# Patient Record
Sex: Male | Born: 1940 | Race: White | Hispanic: No | Marital: Married | Smoking: Never smoker
Health system: Southern US, Community
[De-identification: ages and names within clinical notes are randomized; demographics above are authoritative.]

## PROBLEM LIST (undated history)

## (undated) ENCOUNTER — Emergency Department
Admission: EM | Discharge status: Against Medical Advice | Payer: BLUE CROSS/BLUE SHIELD | Attending: Emergency Medicine | Admitting: Emergency Medicine

## (undated) DIAGNOSIS — Z860100 Personal history of colon polyps, unspecified: Secondary | ICD-10-CM

## (undated) DIAGNOSIS — C61 Malignant neoplasm of prostate: Secondary | ICD-10-CM

## (undated) DIAGNOSIS — I10 Essential (primary) hypertension: Secondary | ICD-10-CM

## (undated) DIAGNOSIS — Z973 Presence of spectacles and contact lenses: Secondary | ICD-10-CM

## (undated) DIAGNOSIS — R011 Cardiac murmur, unspecified: Secondary | ICD-10-CM

## (undated) DIAGNOSIS — F0280 Dementia in other diseases classified elsewhere without behavioral disturbance: Secondary | ICD-10-CM

## (undated) DIAGNOSIS — F0390 Unspecified dementia without behavioral disturbance: Secondary | ICD-10-CM

## (undated) DIAGNOSIS — G309 Alzheimer's disease, unspecified: Secondary | ICD-10-CM

## (undated) HISTORY — DX: Alzheimer's disease, unspecified: G30.9

## (undated) HISTORY — PX: PROSTATE SURGERY: SHX751

## (undated) HISTORY — DX: Cardiac murmur, unspecified: R01.1

## (undated) HISTORY — DX: Essential (primary) hypertension: I10

## (undated) HISTORY — DX: Malignant neoplasm of prostate: C61

## (undated) HISTORY — DX: Personal history of colonic polyps: Z86.010

## (undated) HISTORY — PX: PROSTATE BIOPSY: SHX241

## (undated) HISTORY — PX: TONSILLECTOMY: SUR1361

## (undated) HISTORY — DX: Personal history of colon polyps, unspecified: Z86.0100

---

## 1999-08-05 ENCOUNTER — Encounter: Admission: RE | Admit: 1999-08-05 | Discharge: 1999-08-05 | Payer: Self-pay | Admitting: *Deleted

## 1999-08-05 ENCOUNTER — Encounter: Payer: Self-pay | Admitting: *Deleted

## 2004-04-27 ENCOUNTER — Encounter: Payer: Self-pay | Admitting: Internal Medicine

## 2004-04-27 HISTORY — PX: COLONOSCOPY W/ POLYPECTOMY: SHX1380

## 2004-04-27 LAB — HM COLONOSCOPY

## 2005-03-20 ENCOUNTER — Ambulatory Visit: Payer: Self-pay | Admitting: Internal Medicine

## 2005-03-28 ENCOUNTER — Ambulatory Visit: Payer: Self-pay | Admitting: Internal Medicine

## 2005-08-24 ENCOUNTER — Ambulatory Visit: Payer: Self-pay | Admitting: Internal Medicine

## 2005-09-05 ENCOUNTER — Ambulatory Visit: Payer: Self-pay | Admitting: Internal Medicine

## 2005-10-26 ENCOUNTER — Ambulatory Visit: Payer: Self-pay | Admitting: Internal Medicine

## 2006-02-23 ENCOUNTER — Ambulatory Visit: Payer: Self-pay | Admitting: Internal Medicine

## 2006-08-22 ENCOUNTER — Ambulatory Visit: Payer: Self-pay | Admitting: Internal Medicine

## 2006-08-22 LAB — CONVERTED CEMR LAB
ALT: 35 units/L (ref 0–40)
AST: 31 units/L (ref 0–37)
Albumin: 3.8 g/dL (ref 3.5–5.2)
Alkaline Phosphatase: 98 units/L (ref 39–117)
BUN: 13 mg/dL (ref 6–23)
Basophils Absolute: 0 10*3/uL (ref 0.0–0.1)
Basophils Relative: 0.5 % (ref 0.0–1.0)
Bilirubin, Direct: 0.1 mg/dL (ref 0.0–0.3)
CO2: 30 meq/L (ref 19–32)
Calcium: 9.7 mg/dL (ref 8.4–10.5)
Chloride: 104 meq/L (ref 96–112)
Cholesterol: 232 mg/dL (ref 0–200)
Creatinine, Ser: 1 mg/dL (ref 0.4–1.5)
Direct LDL: 155.6 mg/dL
Eosinophils Absolute: 0.1 10*3/uL (ref 0.0–0.6)
Eosinophils Relative: 1.3 % (ref 0.0–5.0)
GFR calc Af Amer: 96 mL/min
GFR calc non Af Amer: 79 mL/min
Glucose, Bld: 103 mg/dL — ABNORMAL HIGH (ref 70–99)
HCT: 44.2 % (ref 39.0–52.0)
HDL: 56.9 mg/dL (ref >39.0)
Hemoglobin: 15.4 g/dL (ref 13.0–17.0)
Lymphocytes Relative: 31 % (ref 12.0–46.0)
MCHC: 34.8 g/dL (ref 30.0–36.0)
MCV: 94.3 fL (ref 78.0–100.0)
Monocytes Absolute: 0.6 10*3/uL (ref 0.2–0.7)
Monocytes Relative: 10.6 % (ref 3.0–11.0)
Neutro Abs: 3 10*3/uL (ref 1.4–7.7)
Neutrophils Relative %: 56.6 % (ref 43.0–77.0)
PSA: 2.26 ng/mL (ref 0.10–4.00)
Platelets: 319 10*3/uL (ref 150–400)
Potassium: 4 meq/L (ref 3.5–5.1)
RBC: 4.69 M/uL (ref 4.22–5.81)
RDW: 12.4 % (ref 11.5–14.6)
Sodium: 143 meq/L (ref 135–145)
TSH: 1.32 microintl units/mL (ref 0.35–5.50)
Total Bilirubin: 1.2 mg/dL (ref 0.3–1.2)
Total CHOL/HDL Ratio: 4.1
Total Protein: 7.1 g/dL (ref 6.0–8.3)
Triglycerides: 131 mg/dL (ref 0–149)
VLDL: 26 mg/dL (ref 0–40)
WBC: 5.4 10*3/uL (ref 4.5–10.5)

## 2006-08-29 ENCOUNTER — Ambulatory Visit: Payer: Self-pay | Admitting: Internal Medicine

## 2007-01-18 ENCOUNTER — Ambulatory Visit: Payer: Self-pay | Admitting: Internal Medicine

## 2007-02-05 DIAGNOSIS — I10 Essential (primary) hypertension: Secondary | ICD-10-CM

## 2007-02-05 DIAGNOSIS — Z860100 Personal history of colon polyps, unspecified: Secondary | ICD-10-CM | POA: Insufficient documentation

## 2007-02-05 DIAGNOSIS — I4510 Unspecified right bundle-branch block: Secondary | ICD-10-CM

## 2007-02-05 DIAGNOSIS — E785 Hyperlipidemia, unspecified: Secondary | ICD-10-CM

## 2007-03-06 ENCOUNTER — Ambulatory Visit: Payer: Self-pay | Admitting: Internal Medicine

## 2007-04-24 ENCOUNTER — Ambulatory Visit: Payer: Self-pay | Admitting: Internal Medicine

## 2007-07-25 ENCOUNTER — Encounter: Payer: Self-pay | Admitting: Internal Medicine

## 2007-09-03 ENCOUNTER — Ambulatory Visit: Payer: Self-pay | Admitting: Internal Medicine

## 2007-09-03 LAB — CONVERTED CEMR LAB
ALT: 25 units/L (ref 0–53)
AST: 29 units/L (ref 0–37)
Albumin: 3.7 g/dL (ref 3.5–5.2)
Alkaline Phosphatase: 94 units/L (ref 39–117)
BUN: 11 mg/dL (ref 6–23)
Basophils Absolute: 0 10*3/uL (ref 0.0–0.1)
Basophils Relative: 0.7 % (ref 0.0–1.0)
Bilirubin Urine: NEGATIVE
Bilirubin, Direct: 0.2 mg/dL (ref 0.0–0.3)
Blood in Urine, dipstick: NEGATIVE
CO2: 29 meq/L (ref 19–32)
Calcium: 9.8 mg/dL (ref 8.4–10.5)
Chloride: 105 meq/L (ref 96–112)
Cholesterol: 200 mg/dL (ref 0–200)
Creatinine, Ser: 1.2 mg/dL (ref 0.4–1.5)
Eosinophils Absolute: 0.1 10*3/uL (ref 0.0–0.6)
Eosinophils Relative: 2.5 % (ref 0.0–5.0)
GFR calc Af Amer: 78 mL/min
GFR calc non Af Amer: 64 mL/min
Glucose, Bld: 97 mg/dL (ref 70–99)
Glucose, Urine, Semiquant: NEGATIVE
HCT: 43.7 % (ref 39.0–52.0)
HDL: 53.8 mg/dL (ref >39.0)
Hemoglobin: 14.6 g/dL (ref 13.0–17.0)
Ketones, urine, test strip: NEGATIVE
LDL Cholesterol: 130 mg/dL — ABNORMAL HIGH (ref 0–99)
Lymphocytes Relative: 45.2 % (ref 12.0–46.0)
MCHC: 33.5 g/dL (ref 30.0–36.0)
MCV: 95.2 fL (ref 78.0–100.0)
Monocytes Absolute: 0.4 10*3/uL (ref 0.2–0.7)
Monocytes Relative: 11.3 % — ABNORMAL HIGH (ref 3.0–11.0)
Neutro Abs: 1.6 10*3/uL (ref 1.4–7.7)
Neutrophils Relative %: 40.3 % — ABNORMAL LOW (ref 43.0–77.0)
Nitrite: NEGATIVE
PSA: 2.13 ng/mL (ref 0.10–4.00)
Platelets: 298 10*3/uL (ref 150–400)
Potassium: 5.7 meq/L — ABNORMAL HIGH (ref 3.5–5.1)
Protein, U semiquant: NEGATIVE
RBC: 4.59 M/uL (ref 4.22–5.81)
RDW: 12.1 % (ref 11.5–14.6)
Sodium: 141 meq/L (ref 135–145)
Specific Gravity, Urine: 1.02
TSH: 1.09 microintl units/mL (ref 0.35–5.50)
Total Bilirubin: 1 mg/dL (ref 0.3–1.2)
Total CHOL/HDL Ratio: 3.7
Total Protein: 6.5 g/dL (ref 6.0–8.3)
Triglycerides: 79 mg/dL (ref 0–149)
Urobilinogen, UA: 0.2
VLDL: 16 mg/dL (ref 0–40)
WBC Urine, dipstick: NEGATIVE
WBC: 3.9 10*3/uL — ABNORMAL LOW (ref 4.5–10.5)
pH: 5.5

## 2007-09-17 ENCOUNTER — Ambulatory Visit: Payer: Self-pay | Admitting: Internal Medicine

## 2007-09-17 LAB — HM COLONOSCOPY

## 2008-03-17 ENCOUNTER — Ambulatory Visit: Payer: Self-pay | Admitting: Internal Medicine

## 2008-09-15 ENCOUNTER — Ambulatory Visit: Payer: Self-pay | Admitting: Internal Medicine

## 2008-09-16 LAB — CONVERTED CEMR LAB
BUN: 11 mg/dL (ref 6–23)
CO2: 28 meq/L (ref 19–32)
Calcium: 9.3 mg/dL (ref 8.4–10.5)
Chloride: 104 meq/L (ref 96–112)
Cholesterol: 224 mg/dL — ABNORMAL HIGH (ref 0–200)
Creatinine, Ser: 1 mg/dL (ref 0.4–1.5)
Direct LDL: 137.1 mg/dL
GFR calc non Af Amer: 78.93 mL/min (ref >60)
Glucose, Bld: 99 mg/dL (ref 70–99)
HDL: 62.8 mg/dL (ref >39.00)
Potassium: 4.8 meq/L (ref 3.5–5.1)
Sodium: 140 meq/L (ref 135–145)
Total CHOL/HDL Ratio: 4
Triglycerides: 115 mg/dL (ref 0.0–149.0)
VLDL: 23 mg/dL (ref 0.0–40.0)

## 2009-03-24 ENCOUNTER — Ambulatory Visit: Payer: Self-pay | Admitting: Internal Medicine

## 2010-05-02 ENCOUNTER — Ambulatory Visit: Payer: Self-pay | Admitting: Internal Medicine

## 2010-05-03 LAB — CONVERTED CEMR LAB
BUN: 14 mg/dL (ref 6–23)
CO2: 25 meq/L (ref 19–32)
Calcium: 9.4 mg/dL (ref 8.4–10.5)
Chloride: 98 meq/L (ref 96–112)
Cholesterol: 194 mg/dL (ref 0–200)
Creatinine, Ser: 1 mg/dL (ref 0.4–1.5)
GFR calc non Af Amer: 78.55 mL/min (ref >60)
Glucose, Bld: 110 mg/dL — ABNORMAL HIGH (ref 70–99)
HDL: 67.3 mg/dL (ref >39.00)
LDL Cholesterol: 109 mg/dL — ABNORMAL HIGH (ref 0–99)
PSA: 3.4 ng/mL (ref 0.10–4.00)
Potassium: 4.1 meq/L (ref 3.5–5.1)
Sodium: 134 meq/L — ABNORMAL LOW (ref 135–145)
TSH: 0.99 microintl units/mL (ref 0.35–5.50)
Total CHOL/HDL Ratio: 3
Triglycerides: 91 mg/dL (ref 0.0–149.0)
VLDL: 18.2 mg/dL (ref 0.0–40.0)

## 2010-07-26 NOTE — Procedures (Signed)
Summary: Colonoscopy, Polypectomy/[facility]  Colonoscopy, Polypectomy/[facility]   Imported By: Maryln Gottron 05/18/2010 15:13:32  _____________________________________________________________________  External Attachment:    Type:   Image     Comment:   External Document

## 2010-07-26 NOTE — Assessment & Plan Note (Signed)
Summary: med check/refill/cjr   Vital Signs:  Patient profile:   70 year old male Weight:      175 pounds BMI:     25.20 Temp:     97.7 degrees F oral Pulse rate:   76 / minute BP sitting:   152 / 76  (left arm) Cuff size:   large  Vitals Entered By: Alfred Levins, CMA (May 02, 2010 10:46 AM) CC: renew meds   CC:  renew meds.  History of Present Illness:  Follow-Up Visit      This is a 70 year old man who presents for Follow-up visit.  The patient denies chest pain and palpitations.  Since the last visit the patient notes no new problems or concerns.  The patient reports taking meds as prescribed and monitoring BP.  When questioned about possible medication side effects, the patient notes none.   home BPs 110s-140/70s  All other systems reviewed and were negative   Current Medications (verified): 1)  Lisinopril 10 Mg  Tabs (Lisinopril) .... Take 1 Tab By Mouth Daily--Needs Office Visit For Additional Refills 2)  Occuvite .... Once Daily  Allergies (verified): No Known Drug Allergies  Family History: father --deceased 7 (vascular in nature) mother htn alive at 88---doing well except some MSK complaints  Physical Exam  General:  alert and well-developed.   Head:  normocephalic and atraumatic.   Eyes:  pupils equal and pupils round.   Neck:  No deformities, masses, or tenderness noted. Chest Wall:  No deformities, masses, tenderness or gynecomastia noted. Lungs:  Normal respiratory effort, chest expands symmetrically. Lungs are clear to auscultation, no crackles or wheezes. Heart:  normal rate and regular rhythm.   Abdomen:  Bowel sounds positive,abdomen soft and non-tender without masses, organomegaly or hernias noted.   Impression & Recommendations:  Problem # 1:  HYPERTENSION (ICD-401.9)  sounds better controlled at home monitor at home. goal bp < 140/90 His updated medication list for this problem includes:    Lisinopril 20 Mg Tabs (Lisinopril) .Marland Kitchen... 1  tablet by mouth daily or as directed  BP today: 152/76 Prior BP: 138/74 (09/15/2008)  Labs Reviewed: K+: 4.8 (09/15/2008) Creat: : 1.0 (09/15/2008)   Chol: 224 (09/15/2008)   HDL: 62.80 (09/15/2008)   LDL: 130 (09/03/2007)   TG: 115.0 (09/15/2008)  Orders: Venipuncture (10272) TLB-BMP (Basic Metabolic Panel-BMET) (80048-METABOL) TLB-Lipid Panel (80061-LIPID)  Complete Medication List: 1)  Lisinopril 20 Mg Tabs (Lisinopril) .Marland Kitchen.. 1 tablet by mouth daily or as directed 2)  Occuvite  .... Once daily  Other Orders: TLB-TSH (Thyroid Stimulating Hormone) (84443-TSH) TLB-PSA (Prostate Specific Antigen) (84153-PSA)  Prescriptions: LISINOPRIL 20 MG TABS (LISINOPRIL) 1 tablet by mouth daily or as directed  #90 x 3   Entered and Authorized by:   Birdie Sons MD   Signed by:   Birdie Sons MD on 05/02/2010   Method used:   Electronically to        [facility] [phone]* (retail)       [address]       [place], [place]  [address]       Ph: [phone] or [phone]       Fax: [phone]   RxID:   [id number]    Orders Added: 1)  Venipuncture [54270] 2)  TLB-BMP (Basic Metabolic Panel-BMET) [80048-METABOL] 3)  TLB-Lipid Panel [80061-LIPID] 4)  TLB-TSH (Thyroid Stimulating Hormone) [84443-TSH] 5)  TLB-PSA (Prostate Specific Antigen) [62376-EGB] 6)  Est. Patient Level III [15176]  Appended Document: Orders Update  Clinical Lists Changes  Orders: Added new Service order of Specimen Handling (99371) - Signed      Appended Document: med check/refill/cjr    Nurse Visit   Allergies: No Known Drug Allergies  Orders Added: 1)  Flu Vaccine 2yrs + MEDICARE PATIENTS [Q2039] 2)  Administration Flu vaccine - MCR [G0008] Flu Vaccine Consent Questions     Do you have a history of severe allergic reactions to this vaccine? no    Any prior history of allergic reactions to egg and/or gelatin? no    Do you have a sensitivity to the preservative Thimersol? no    Do you  have a past history of Guillan-Barre Syndrome? no    Do you currently have an acute febrile illness? no    Have you ever had a severe reaction to latex? no    Vaccine information given and explained to patient? yes    Are you currently pregnant? no    Lot Number:[id number]   Exp Date:12/24/2010   Site Given  Left Deltoid IM .[email or web address]

## 2010-10-31 ENCOUNTER — Ambulatory Visit (INDEPENDENT_AMBULATORY_CARE_PROVIDER_SITE_OTHER): Payer: Medicare Other | Admitting: Internal Medicine

## 2010-10-31 DIAGNOSIS — Z23 Encounter for immunization: Secondary | ICD-10-CM

## 2010-10-31 DIAGNOSIS — Z2911 Encounter for prophylactic immunotherapy for respiratory syncytial virus (RSV): Secondary | ICD-10-CM

## 2011-01-06 ENCOUNTER — Ambulatory Visit (INDEPENDENT_AMBULATORY_CARE_PROVIDER_SITE_OTHER): Payer: Medicare Other | Admitting: Internal Medicine

## 2011-01-06 ENCOUNTER — Encounter: Payer: Self-pay | Admitting: Internal Medicine

## 2011-01-06 DIAGNOSIS — J40 Bronchitis, not specified as acute or chronic: Secondary | ICD-10-CM

## 2011-01-06 MED ORDER — CHLORPHENIRAMINE-HYDROCODONE 8-10 MG/5ML PO LQCR: 5.0000 mL | Freq: Two times a day (BID) | ORAL | Status: DC | PRN

## 2011-01-06 MED ORDER — DOXYCYCLINE HYCLATE 100 MG PO TABS: 100.0000 mg | ORAL_TABLET | Freq: Two times a day (BID) | ORAL | Status: AC

## 2011-01-06 NOTE — Assessment & Plan Note (Signed)
Begin ten-day course of doxycycline. Attempt Tussionex when necessary cough. Cautioned regarding possible sedating effect. Followup if no improvement or worsening 

## 2011-01-06 NOTE — Progress Notes (Signed)
  Subjective:    Patient ID: Jerry Carroll, male    DOB: September 28, 1940, 70 y.o.   MRN: [id number]  HPI Pt presents to clinic for evaluation of cough. Notes two week history of cough intermittently productive for green sputum without hemoptysis. Cough is worse at night and is interfering with sleep. Has nasal congestion and runny nose as well as associated sore throat. No known sick exposure. Not improving with over-the-counter medication. No other exacerbating or alleviating factors. No other complaints.  Reviewed past medical history, medications and allergies.  Review of Systems see history of present illness      Objective:   Physical Exam    Physical Exam  [nursing notereviewed. Constitutional:  appears well-developed and well-nourished. No distress.  HENT:  Head: Normocephalic and atraumatic.  Right Ear: Tympanic membrane, external ear and ear canal normal.  Left Ear: Tympanic membrane, external ear and ear canal normal.  Nose: Nose normal.  Mouth/Throat: Oropharynx is clear and moist. No oropharyngeal exudate.  Eyes: Conjunctivae are normal. No scleral icterus.  Neck: Neck supple.  Cardiovascular: Normal rate, regular rhythm and normal heart sounds.  Exam reveals no gallop and no friction rub.   No murmur heard. Pulmonary/Chest: Effort normal and breath sounds normal. No respiratory distress.  no wheezes.  no rales.  Lymphadenopathy:     no cervical adenopathy.  Neurological:  alert.  Skin: Skin is warm and dry.  not diaphoretic.      Assessment & Plan:

## 2011-04-18 ENCOUNTER — Ambulatory Visit (INDEPENDENT_AMBULATORY_CARE_PROVIDER_SITE_OTHER): Payer: Medicare Other

## 2011-04-18 DIAGNOSIS — Z23 Encounter for immunization: Secondary | ICD-10-CM

## 2011-05-04 ENCOUNTER — Encounter: Payer: Federal, State, Local not specified - PPO | Admitting: Internal Medicine

## 2011-05-26 ENCOUNTER — Encounter: Payer: Self-pay | Admitting: Internal Medicine

## 2011-05-26 ENCOUNTER — Ambulatory Visit (INDEPENDENT_AMBULATORY_CARE_PROVIDER_SITE_OTHER): Payer: Medicare Other | Admitting: Internal Medicine

## 2011-05-26 VITALS — BP 142/76 | HR 60 | Temp 97.9°F | Ht 70.0 in | Wt 169.0 lb

## 2011-05-26 DIAGNOSIS — Z0000 Encounter for general adult medical examination without abnormal findings: Secondary | ICD-10-CM

## 2011-05-26 DIAGNOSIS — Z23 Encounter for immunization: Secondary | ICD-10-CM

## 2011-05-26 DIAGNOSIS — N400 Enlarged prostate without lower urinary tract symptoms: Secondary | ICD-10-CM

## 2011-05-26 DIAGNOSIS — Z86010 Personal history of colonic polyps: Secondary | ICD-10-CM

## 2011-05-26 DIAGNOSIS — I10 Essential (primary) hypertension: Secondary | ICD-10-CM

## 2011-05-26 DIAGNOSIS — E785 Hyperlipidemia, unspecified: Secondary | ICD-10-CM

## 2011-05-26 LAB — BASIC METABOLIC PANEL
Chloride: 101 mEq/L (ref 96–112)
Creatinine, Ser: 1.1 mg/dL (ref 0.4–1.5)
Potassium: 5 mEq/L (ref 3.5–5.1)

## 2011-05-26 LAB — HEPATIC FUNCTION PANEL
AST: 41 U/L — ABNORMAL HIGH (ref 0–37)
Albumin: 4.1 g/dL (ref 3.5–5.2)
Total Protein: 6.7 g/dL (ref 6.0–8.3)

## 2011-05-26 LAB — POCT URINALYSIS DIPSTICK
Bilirubin, UA: NEGATIVE
Blood, UA: NEGATIVE
Glucose, UA: NEGATIVE
Spec Grav, UA: 1.015

## 2011-05-26 LAB — LIPID PANEL
Cholesterol: 230 mg/dL — ABNORMAL HIGH (ref 0–200)
Total CHOL/HDL Ratio: 3
Triglycerides: 55 mg/dL (ref 0.0–149.0)

## 2011-05-26 LAB — CBC WITH DIFFERENTIAL/PLATELET
Basophils Absolute: 0 10*3/uL (ref 0.0–0.1)
Lymphocytes Relative: 34.6 % (ref 12.0–46.0)
Lymphs Abs: 1.3 10*3/uL (ref 0.7–4.0)
Monocytes Relative: 11.4 % (ref 3.0–12.0)
Platelets: 287 10*3/uL (ref 150.0–400.0)
RDW: 13.7 % (ref 11.5–14.6)

## 2011-05-26 LAB — LDL CHOLESTEROL, DIRECT: Direct LDL: 133.8 mg/dL

## 2011-05-26 LAB — TSH: TSH: 0.91 u[IU]/mL (ref 0.35–5.50)

## 2011-05-28 NOTE — Progress Notes (Signed)
Subjective:    Jerry Carroll is a 70 y.o. male who presents for Medicare Annual/Subsequent preventive examination.   Preventive Screening-Counseling & Management  Tobacco History  Smoking status  . Never Smoker   Smokeless tobacco  . Never Used    Problems Prior to Visit 1.   Current Problems (verified) Patient Active Problem List  Diagnoses  . HYPERLIPIDEMIA  . HYPERTENSION  . BUNDLE BRANCH BLOCK, RIGHT  . COLONIC POLYPS, HX OF  . Bronchitis    Medications Prior to Visit Current Outpatient Prescriptions on File Prior to Visit  Medication Sig Dispense Refill  . lisinopril (PRINIVIL,ZESTRIL) 20 MG tablet Take 10 mg by mouth daily.         Current Medications (verified) Current Outpatient Prescriptions  Medication Sig Dispense Refill  . lisinopril (PRINIVIL,ZESTRIL) 20 MG tablet Take 10 mg by mouth daily.          Allergies (verified) Review of patient's allergies indicates not on file.   PAST HISTORY  Family History No family history on file.  Social History History  Substance Use Topics  . Smoking status: Never Smoker   . Smokeless tobacco: Never Used  . Alcohol Use: Yes    Are there smokers in your home (other than you)?  No  Risk Factors Current exercise habits: Patient exercises regularly. Dietary issues discussed: Follows a low-fat diet.  Cardiac risk factors: advanced age (older than 90 for men, 52 for women).  Depression Screen patient denies depression Activities of Daily Living: Patient is able to do all activities of daily living. No recent falls.    Hearing Difficulties: Patient has no trouble with hearing.  Patient has no memory loss. Cognitive Testing  Patient is alert and oriented without any cognitive abnormalities.   List the Names of Other Physician/Practitioners you currently use: 1.    Indicate any recent Medical Services you may have received from other than [facility] providers in the past year (date may be  approximate).  Immunization History  Administered Date(s) Administered  . Influenza Split 04/18/2011  . Influenza Whole 04/24/2007, 03/17/2008, 03/24/2009, 05/02/2010  . Pneumococcal Polysaccharide 06/26/2005, 05/26/2011  . Tdap 05/26/2011  . Zoster 10/31/2010    Screening Tests Health Maintenance  Topic Date Due  . Influenza Vaccine  03/26/2012  . Colonoscopy  09/16/2017  . Tetanus/tdap  05/25/2021  . Pneumococcal Polysaccharide Vaccine Age 70 And Over  Completed  . Zostavax  Completed    All answers were reviewed with the patient and necessary referrals were made:  Judie Petit, MD   05/28/2011   History reviewed: allergies, current medications, past family history, past medical history, past social history, past surgical history and problem list  Review of Systems     patient denies chest pain, shortness of breath, orthopnea. Denies lower extremity edema, abdominal pain, change in appetite, change in bowel movements. Patient denies rashes, musculoskeletal complaints. No other specific complaints in a complete review of systems.   Objective:      Blood pressure 142/76, pulse 60, temperature 97.9 F (36.6 C), temperature source Oral, height 5\' 10"  (1.778 m), weight 169 lb (76.658 kg). Body mass index is 24.25 kg/(m^2).  well-developed well-nourished male in no acute distress. HEENT exam atraumatic, normocephalic, neck supple without jugular venous distention. Chest clear to auscultation cardiac exam S1-S2 are regular. Abdominal exam overweight with bowel sounds, soft and nontender. Extremities no edema. Neurologic exam is alert with a normal gait.      Assessment:  Well visit   Health  maintenance up-to-date.     Plan:     During the course of the visit the patient was educated and counseled about appropriate screening and preventive services including:    Pneumococcal vaccine   Td vaccine  Prostate cancer screening  Colorectal cancer screening  Diet  review for nutrition referral? Yes ____  Not Indicated _x___   Patient Instructions (the written plan) was given to the patient.  Medicare Attestation I have personally reviewed: The patient's medical and social history Their use of alcohol, tobacco or illicit drugs Their current medications and supplements The patient's functional ability including ADLs,fall risks, home safety risks, cognitive, and hearing and visual impairment Diet and physical activities Evidence for depression or mood disorders  The patient's weight, height, BMI, and visual acuity have been recorded in the chart.  I have made referrals, counseling, and provided education to the patient based on review of the above and I have provided the patient with a written personalized care plan for preventive services.     Judie Petit, MD   05/28/2011

## 2011-09-05 ENCOUNTER — Other Ambulatory Visit: Payer: Self-pay | Admitting: Internal Medicine

## 2012-07-05 ENCOUNTER — Ambulatory Visit: Payer: Medicare Other | Admitting: Internal Medicine

## 2012-07-31 ENCOUNTER — Encounter: Payer: Medicare Other | Admitting: Internal Medicine

## 2012-08-14 ENCOUNTER — Ambulatory Visit (INDEPENDENT_AMBULATORY_CARE_PROVIDER_SITE_OTHER): Payer: Medicare Other | Admitting: Internal Medicine

## 2012-08-14 ENCOUNTER — Encounter: Payer: Self-pay | Admitting: Internal Medicine

## 2012-08-14 VITALS — BP 136/70 | HR 76 | Temp 98.4°F | Ht 70.25 in | Wt 163.0 lb

## 2012-08-14 DIAGNOSIS — R7309 Other abnormal glucose: Secondary | ICD-10-CM

## 2012-08-14 DIAGNOSIS — I10 Essential (primary) hypertension: Secondary | ICD-10-CM

## 2012-08-14 DIAGNOSIS — R972 Elevated prostate specific antigen [PSA]: Secondary | ICD-10-CM

## 2012-08-14 DIAGNOSIS — Z0000 Encounter for general adult medical examination without abnormal findings: Secondary | ICD-10-CM

## 2012-08-14 DIAGNOSIS — D72819 Decreased white blood cell count, unspecified: Secondary | ICD-10-CM

## 2012-08-14 DIAGNOSIS — E785 Hyperlipidemia, unspecified: Secondary | ICD-10-CM

## 2012-08-14 LAB — TSH: TSH: 0.74 u[IU]/mL (ref 0.35–5.50)

## 2012-08-14 LAB — BASIC METABOLIC PANEL
CO2: 25 mEq/L (ref 19–32)
Calcium: 9.8 mg/dL (ref 8.4–10.5)
Chloride: 101 mEq/L (ref 96–112)
Glucose, Bld: 119 mg/dL — ABNORMAL HIGH (ref 70–99)
Potassium: 4.7 mEq/L (ref 3.5–5.1)
Sodium: 136 mEq/L (ref 135–145)

## 2012-08-14 LAB — HEPATIC FUNCTION PANEL
ALT: 20 U/L (ref 0–53)
AST: 26 U/L (ref 0–37)
Bilirubin, Direct: 0.2 mg/dL (ref 0.0–0.3)
Total Bilirubin: 1.2 mg/dL (ref 0.3–1.2)

## 2012-08-14 LAB — CBC WITH DIFFERENTIAL/PLATELET
Basophils Absolute: 0 10*3/uL (ref 0.0–0.1)
Eosinophils Relative: 1.2 % (ref 0.0–5.0)
HCT: 43.1 % (ref 39.0–52.0)
Hemoglobin: 14.8 g/dL (ref 13.0–17.0)
Lymphocytes Relative: 36.3 % (ref 12.0–46.0)
Monocytes Relative: 12.2 % — ABNORMAL HIGH (ref 3.0–12.0)
Neutro Abs: 1.7 10*3/uL (ref 1.4–7.7)
Platelets: 296 10*3/uL (ref 150.0–400.0)
RDW: 12.9 % (ref 11.5–14.6)
WBC: 3.4 10*3/uL — ABNORMAL LOW (ref 4.5–10.5)

## 2012-08-14 LAB — LIPID PANEL
Cholesterol: 218 mg/dL — ABNORMAL HIGH (ref 0–200)
Total CHOL/HDL Ratio: 3
VLDL: 16.4 mg/dL (ref 0.0–40.0)

## 2012-08-14 LAB — PSA: PSA: 3.77 ng/mL (ref 0.10–4.00)

## 2012-08-14 NOTE — Assessment & Plan Note (Signed)
No trouble on meds Continue same

## 2012-08-15 NOTE — Progress Notes (Signed)
Subjective:    Jerry Carroll is a 72 y.o. male who presents for Medicare Annual/Subsequent preventive examination.   Preventive Screening-Counseling & Management  Tobacco History  Smoking status  . Never Smoker   Smokeless tobacco  . Never Used    Problems Prior to Visit 1.   Current Problems (verified) Patient Active Problem List  Diagnosis  . HYPERLIPIDEMIA  . HYPERTENSION  . BUNDLE BRANCH BLOCK, RIGHT  . COLONIC POLYPS, HX OF    Medications Prior to Visit Current Outpatient Prescriptions on File Prior to Visit  Medication Sig Dispense Refill  . lisinopril (PRINIVIL,ZESTRIL) 20 MG tablet TAKE 1 TABLET EVERY DAY OR AS DIRECTED  90 tablet  2   No current facility-administered medications on file prior to visit.    Current Medications (verified) Current Outpatient Prescriptions  Medication Sig Dispense Refill  . lisinopril (PRINIVIL,ZESTRIL) 20 MG tablet TAKE 1 TABLET EVERY DAY OR AS DIRECTED  90 tablet  2   No current facility-administered medications for this visit.     Allergies (verified) Review of patient's allergies indicates not on file.   PAST HISTORY  Family History History reviewed. No pertinent family history.  Social History History  Substance Use Topics  . Smoking status: Never Smoker   . Smokeless tobacco: Never Used  . Alcohol Use: Yes    Are there smokers in your home (other than you)?  No  Risk Factors Current exercise habits: exercises regularly Dietary issues discussed: not necessary  Cardiac risk factors: advanced age (older than 83 for men, 60 for women).  Depression Screen No depression  Activities of Daily Living In your present state of health, do you have any difficulty performing the following activities?:  Driving? No Managing money?  No  Hearing Difficulties: No  Cognitive Testing  No cognitive deficits  List the Names of Other Physician/Practitioners you currently use: 1.    Indicate any recent Medical  Services you may have received from other than [facility] providers in the past year (date may be approximate).  Immunization History  Administered Date(s) Administered  . Influenza Split 04/18/2011  . Influenza Whole 04/24/2007, 03/17/2008, 03/24/2009, 05/02/2010  . Pneumococcal Polysaccharide 06/26/2005, 05/26/2011  . Tdap 05/26/2011  . Zoster 10/31/2010    Screening Tests Health Maintenance  Topic Date Due  . Influenza Vaccine  02/24/2013  . Colonoscopy  09/16/2017  . Tetanus/tdap  05/25/2021  . Pneumococcal Polysaccharide Vaccine Age 8 And Over  Completed  . Zostavax  Completed    All answers were reviewed with the patient and necessary referrals were made:  Judie Petit, MD   08/15/2012   History reviewed: allergies, current medications, past family history, past medical history, past social history, past surgical history and problem list  Review of Systems  patient denies chest pain, shortness of breath, orthopnea. Denies lower extremity edema, abdominal pain, change in appetite, change in bowel movements. Patient denies rashes, musculoskeletal complaints. No other specific complaints in a complete review of systems.    Objective:      Blood pressure 136/70, pulse 76, temperature 98.4 F (36.9 C), temperature source Oral, height 5' 10.25" (1.784 m), weight 163 lb (73.936 kg). Body mass index is 23.23 kg/(m^2).   well-developed well-nourished male in no acute distress. HEENT exam atraumatic, normocephalic, neck supple without jugular venous distention. Chest clear to auscultation cardiac exam S1-S2 are regular. Abdominal exam overweight with bowel sounds, soft and nontender. Extremities no edema. Neurologic exam is alert with a normal gait.  Assessment:     Well visit      Plan:     During the course of the visit the patient was educated and counseled about appropriate screening and preventive services including:    Pneumococcal vaccine   Influenza  vaccine  Td vaccine  Prostate cancer screening  Colorectal cancer screening  Diet review for nutrition referral? Yes ____  Not Indicated _x___   Patient Instructions (the written plan) was given to the patient.  Medicare Attestation I have personally reviewed: The patient's medical and social history Their use of alcohol, tobacco or illicit drugs Their current medications and supplements The patient's functional ability including ADLs,fall risks, home safety risks, cognitive, and hearing and visual impairment Diet and physical activities Evidence for depression or mood disorders  The patient's weight, height, BMI, and visual acuity have been recorded in the chart.  I have made referrals, counseling, and provided education to the patient based on review of the above and I have provided Judie Petit, MD   08/15/2012

## 2012-11-14 ENCOUNTER — Other Ambulatory Visit: Payer: Self-pay | Admitting: Internal Medicine

## 2013-09-06 ENCOUNTER — Other Ambulatory Visit: Payer: Self-pay | Admitting: Internal Medicine

## 2013-10-15 ENCOUNTER — Ambulatory Visit (INDEPENDENT_AMBULATORY_CARE_PROVIDER_SITE_OTHER): Payer: Medicare Other | Admitting: Internal Medicine

## 2013-10-15 ENCOUNTER — Encounter: Payer: Self-pay | Admitting: Internal Medicine

## 2013-10-15 ENCOUNTER — Telehealth: Payer: Self-pay | Admitting: Internal Medicine

## 2013-10-15 VITALS — BP 144/80 | HR 64 | Temp 97.9°F | Ht 70.0 in | Wt 159.0 lb

## 2013-10-15 DIAGNOSIS — E785 Hyperlipidemia, unspecified: Secondary | ICD-10-CM

## 2013-10-15 DIAGNOSIS — N138 Other obstructive and reflux uropathy: Secondary | ICD-10-CM

## 2013-10-15 DIAGNOSIS — N401 Enlarged prostate with lower urinary tract symptoms: Secondary | ICD-10-CM

## 2013-10-15 DIAGNOSIS — N139 Obstructive and reflux uropathy, unspecified: Secondary | ICD-10-CM

## 2013-10-15 DIAGNOSIS — I10 Essential (primary) hypertension: Secondary | ICD-10-CM

## 2013-10-15 LAB — BASIC METABOLIC PANEL
BUN: 12 mg/dL (ref 6–23)
CO2: 28 meq/L (ref 19–32)
Calcium: 9.6 mg/dL (ref 8.4–10.5)
Chloride: 102 mEq/L (ref 96–112)
Creatinine, Ser: 1 mg/dL (ref 0.4–1.5)
GFR: 81.54 mL/min (ref >60.00)
GLUCOSE: 99 mg/dL (ref 70–99)
POTASSIUM: 4.6 meq/L (ref 3.5–5.1)
SODIUM: 139 meq/L (ref 135–145)

## 2013-10-15 LAB — HEPATIC FUNCTION PANEL
ALBUMIN: 4 g/dL (ref 3.5–5.2)
ALK PHOS: 81 U/L (ref 39–117)
ALT: 21 U/L (ref 0–53)
AST: 26 U/L (ref 0–37)
BILIRUBIN DIRECT: 0.2 mg/dL (ref 0.0–0.3)
Total Bilirubin: 1.2 mg/dL (ref 0.3–1.2)
Total Protein: 6.6 g/dL (ref 6.0–8.3)

## 2013-10-15 LAB — LIPID PANEL
CHOL/HDL RATIO: 3
Cholesterol: 198 mg/dL (ref 0–200)
HDL: 75 mg/dL (ref >39.00)
LDL Cholesterol: 109 mg/dL — ABNORMAL HIGH (ref 0–99)
Triglycerides: 70 mg/dL (ref 0.0–149.0)
VLDL: 14 mg/dL (ref 0.0–40.0)

## 2013-10-15 LAB — PSA: PSA: 4.76 ng/mL — AB (ref 0.10–4.00)

## 2013-10-15 LAB — TSH: TSH: 0.66 u[IU]/mL (ref 0.35–5.50)

## 2013-10-15 NOTE — Telephone Encounter (Signed)
Relevant patient education assigned to patient using Emmi. ° °

## 2013-10-15 NOTE — Progress Notes (Signed)
htn- no home bps or at least not very often.   Lipids- no treatment Has a known RBBB- asx  O/w feels well and has no complaints  Well-developed male in no acute distress. HEENT exam atraumatic, normocephalic, extraocular muscles are intact. Conjunctivae are pink without exudate. Neck is supple without lymphadenopathy, thyromegaly, jugular venous distention. Chest is clear to auscultation without increased work of breathing. Cardiac exam S1-S2 are regular. The PMI is normal. No significant murmurs or gallops. Abdominal exam active bowel sounds, soft, nontender. No abdominal bruits. Extremities no clubbing cyanosis or edema. Peripheral pulses are normal without bruits. Neurologic exam alert and oriented without any motor or sensory deficits. Rectal exam normal tone prostate normal size without masses or asymmetry.

## 2013-10-15 NOTE — Progress Notes (Signed)
Pre visit review using our clinic review tool, if applicable. No additional management support is needed unless otherwise documented below in the visit note. 

## 2013-10-17 NOTE — Assessment & Plan Note (Signed)
Controlled continue same meds and excellent health habits

## 2013-10-21 ENCOUNTER — Other Ambulatory Visit: Payer: Self-pay | Admitting: *Deleted

## 2013-10-21 DIAGNOSIS — R972 Elevated prostate specific antigen [PSA]: Secondary | ICD-10-CM

## 2013-10-21 MED ORDER — CIPROFLOXACIN HCL 500 MG PO TABS: 500.0000 mg | ORAL_TABLET | Freq: Two times a day (BID) | ORAL | Status: DC

## 2013-10-21 NOTE — Telephone Encounter (Signed)
Spoke with the pt and Cipro ordered.  Order was entered for PSA.

## 2013-11-19 ENCOUNTER — Encounter: Payer: Self-pay | Admitting: Physician Assistant

## 2013-11-19 ENCOUNTER — Ambulatory Visit (INDEPENDENT_AMBULATORY_CARE_PROVIDER_SITE_OTHER): Payer: Medicare Other | Admitting: Physician Assistant

## 2013-11-19 VITALS — BP 126/70 | HR 79 | Temp 98.2°F | Resp 18 | Wt 158.0 lb

## 2013-11-19 DIAGNOSIS — R972 Elevated prostate specific antigen [PSA]: Secondary | ICD-10-CM

## 2013-11-19 LAB — PSA: PSA: 4.33 ng/mL — ABNORMAL HIGH (ref 0.10–4.00)

## 2013-11-19 NOTE — Progress Notes (Signed)
Pre visit review using our clinic review tool, if applicable. No additional management support is needed unless otherwise documented below in the visit note. 

## 2013-11-19 NOTE — Progress Notes (Signed)
Subjective:    Patient ID: Jerry Carroll, male    DOB: [birth year], 73 y.o.   MRN: [id number]  HPI Patient is a 73 year old Caucasian male presenting for followup of an increased PSA level. On 10/15/2013 patient's PSA level was elevated. The primary care provider thought this was perhaps because of infection in the prostate. He then prescribed Cipro twice a day x10 days. The patient states that he completed the course of this medication without any complications and tolerated the medication well. He states that he never really had any urinary difficulty, symptoms of pain, or difficulty with bowel movements. He is currently asymptomatic. He denies fevers, chills, nausea, vomiting, diarrhea, and shortness of breath.  A repeat PSA was ordered prior to this visit and will be collected at this visit.  Review of Systems As per the history of present illness and are otherwise negative.  Past Medical History  Diagnosis Date  . Hypertension   . Hyperlipidemia   . RBBB (right bundle branch block)   . History of colonic polyps    History reviewed. No pertinent past surgical history.  reports that he has never smoked. He has never used smokeless tobacco. He reports that he drinks alcohol. He reports that he does not use illicit drugs. family history includes Macular degeneration in his mother. No Known Allergies     Objective:   Physical Exam  Nursing note and vitals reviewed. Constitutional: He is oriented to person, place, and time. He appears well-developed and well-nourished. No distress.  HENT:  Head: Normocephalic and atraumatic.  Eyes: Conjunctivae and EOM are normal. Pupils are equal, round, and reactive to light.  Neck: Normal range of motion. Neck supple.  Cardiovascular: Normal rate, regular rhythm and intact distal pulses.  Exam reveals no gallop and no friction rub.   Murmur heard. Pulmonary/Chest: Effort normal and breath sounds normal. No respiratory distress. He has no  wheezes. He has no rales. He exhibits no tenderness.  Musculoskeletal: Normal range of motion.  Neurological: He is alert and oriented to person, place, and time.  Skin: Skin is warm and dry. No rash noted. He is not diaphoretic. No erythema. No pallor.  Psychiatric: He has a normal mood and affect. His behavior is normal. Judgment and thought content normal.    Filed Vitals:   11/19/13 0853  BP: 126/70  Pulse: 79  Temp: 98.2 F (36.8 C)  Resp: 18    Lab Results  Component Value Date   WBC 3.4* 08/14/2012   HGB 14.8 08/14/2012   HCT 43.1 08/14/2012   PLT 296.0 08/14/2012   GLUCOSE 99 10/15/2013   CHOL 198 10/15/2013   TRIG 70.0 10/15/2013   HDL 75.00 10/15/2013   LDLDIRECT 128.5 08/14/2012   LDLCALC 109* 10/15/2013   ALT 21 10/15/2013   AST 26 10/15/2013   NA 139 10/15/2013   K 4.6 10/15/2013   CL 102 10/15/2013   CREATININE 1.0 10/15/2013   BUN 12 10/15/2013   CO2 28 10/15/2013   TSH 0.66 10/15/2013   PSA 4.76* 10/15/2013   HGBA1C 5.4 08/14/2012      Assessment & Plan:  Jerry Carroll was seen today for follow-up.  Diagnoses and associated orders for this visit:  Elevated PSA Comments: PCP thought possible infection. Pt treated with 10d of Cipro BID. Will recheck PSA (already ordered). - PSA    Will call pt with results of PSA when available.  Follow up as needed.  Patient Instructions  We'll call you with the  results of your lab work when it is available.  Followup as needed.

## 2013-11-19 NOTE — Patient Instructions (Addendum)
We'll call you with the results of your lab work when it is available.  Followup as needed.   Prostate-Specific Antigen The prostate-specific antigen (PSA) is a blood test. It is used to help detect early forms of prostate cancer. The test is usually used along with other tests. The test is also used to follow the course of those who already have prostate cancer or who have been treated for prostate cancer. Some factors interfere with the results of the PSA. The factors listed below will either increase or decrease the PSA levels. They are:  Prescriptions used for male baldness.  Some herbs.  Active prostate infection.  Prior instrumentation or urinary catheterization.  Ejaculation up to 2 days prior to testing.  A noncancerous enlargement of the prostate.  Inflammation of the prostate.  Active urinary tract infection. If your test results are elevated, your caregiver will discuss the results with you. Your caregiver will also let you know if more evaluation is needed. PREPARATION FOR TEST No preparation or fasting is necessary. NORMAL FINDINGS Less than 4 ng/mL or Less than 42mcg/L (SI units) Ranges for normal findings may vary among different laboratories and hospitals. You should always check with your caregiver after having lab work or other tests done to discuss the meaning of your test results and whether your values are considered within normal limits. MEANING OF TEST  A normal value means prostate cancer is less likely. The chance of having prostate cancer increases if the value is between 4 ng/mL and 10 ng/mL. However, further testing will be needed. Values above 10 ng/mL indicate that there is a much higher chance of having prostate cancer (if the above situations that raise PSA are not present). Your caregiver will go over your test results with you and discuss the importance of this test. If this value is elevated, your caregiver may recommend further testing or  evaluation. OBTAINING THE TEST RESULTS It is your responsibility to obtain your test results. Ask the lab or department performing the test when and how you will get your results. Document Released: 07/15/2004 Document Revised: 09/04/2011 Document Reviewed: 01/18/2007 [facility] Patient Information 2014 [place].

## 2013-11-24 ENCOUNTER — Other Ambulatory Visit: Payer: Self-pay | Admitting: Internal Medicine

## 2013-11-24 DIAGNOSIS — R972 Elevated prostate specific antigen [PSA]: Secondary | ICD-10-CM

## 2014-01-03 ENCOUNTER — Other Ambulatory Visit: Payer: Self-pay | Admitting: Internal Medicine

## 2014-03-06 ENCOUNTER — Encounter: Payer: Self-pay | Admitting: Radiation Oncology

## 2014-03-06 NOTE — Progress Notes (Signed)
GU Location of Tumor / Histology: prostatic adenocarcinoma  If Prostate Cancer, Gleason Score is (3 + 4) and PSA is (4.33)  Rober Minion was referred to Dr. Jeffie Pollock by Dr. Leanne Chang for an elevated PSA of 4.76 in April 2015 and 4.33 in May 2015. His PSA was 3.44 in 2011, 3.0 in 2012, and 3.77 in 2014.  Biopsies of prostate  (if applicable) revealed:   Past/Anticipated interventions by urology, if any: patient most interest in active surveillance but, this isn't recommend because of intermediate risk; referred to Dr. Tammi Klippel to discuss brachytherapy; scheduled to follow up with urologist in three month with repeat PSA  Past/Anticipated interventions by medical oncology, if any: no  Weight changes, if any: no  Bowel/Bladder complaints, if any: yes, occasional nocturia   Nausea/Vomiting, if any: no  Pain issues, if any:  no  SAFETY ISSUES:  Prior radiation? no  Pacemaker/ICD? no  Possible current pregnancy? no  Is the patient on methotrexate? no  Current Complaints / other details:  73 year old male. Married. 64 cc prostate volume. NKDA. Dr. Jeffie Pollock feels the patient is a good candidate for EXRT and possibly brachytherapy but, might require downsizing first with androgen therapy.

## 2014-03-08 ENCOUNTER — Encounter: Payer: Self-pay | Admitting: Radiation Oncology

## 2014-03-08 DIAGNOSIS — C61 Malignant neoplasm of prostate: Secondary | ICD-10-CM | POA: Insufficient documentation

## 2014-03-08 NOTE — Progress Notes (Signed)
Radiation Oncology         [phone] ________________________________  Initial outpatient Consultation  Name: Jerry Carroll MRN: [id number]  Date: 03/09/2014  DOB: 05/23/1941  KK:XFGHWE,XHBZJ Mallie Mussel, MD  [name], MD   REFERRING PHYSICIAN: [name], MD  DIAGNOSIS: 73 y.o. 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 gentleman.  He was noted to have an elevated PSA of 4.76 by his primary care physician, Dr. Leanne Chang, and this was rechecked.     Ref. Range 05/02/2010 11:09 05/26/2011 09:52 08/14/2012 09:08 10/15/2013 10:27 11/19/2013 09:59  PSA Latest Range: 0.10-4.00 ng/mL 3.40 3.10 3.77 4.76 (H) 4.33 (H)     Accordingly, he was referred for evaluation in urology by Dr. Jeffie Pollock on 12/22/13,  digital rectal examination was performed at that time revealing a 2+ gland with a palpable small but firm nodule involving the right apex of the gland.  The patient proceeded to transrectal ultrasound with 12 biopsies of the prostate on 01/22/14.  The prostate volume measured 64.33 cc.  Out of 12 core biopsies, 2 were positive.  The maximum Gleason score was 3+4, and this was seen in right mid gland, as shown in the image below:  The patient reviewed the biopsy results with his urologist and he has kindly been referred today for discussion of potential radiation treatment options.  PREVIOUS RADIATION THERAPY: No  PAST MEDICAL HISTORY:  has a past medical history of Hypertension; Hyperlipidemia; RBBB (right bundle branch block); History of colonic polyps; and Prostate cancer.    PAST SURGICAL HISTORY: Past Surgical History  Procedure Laterality Date  . Prostate biopsy    . Tonsillectomy      FAMILY HISTORY: family history includes Macular degeneration in his mother.  SOCIAL HISTORY:  reports that he has never smoked. He has never used smokeless tobacco. He reports that he  drinks alcohol. He reports that he does not use illicit drugs.  ALLERGIES: Review of patient's allergies indicates no known allergies.  MEDICATIONS:  Current Outpatient Prescriptions  Medication Sig Dispense Refill  . lisinopril (PRINIVIL,ZESTRIL) 20 MG tablet TAKE 1 TABLET EVERY DAY OR AS DIRECTED  90 tablet  3   No current facility-administered medications for this encounter.    REVIEW OF SYSTEMS:  A 15 point review of systems is documented in the electronic medical record. This was obtained by the nursing staff. However, I reviewed this with the patient to discuss relevant findings and make appropriate changes.  A comprehensive review of systems was negative..  The patient completed an IPSS and IIEF questionnaire.  His IPSS score was 2 indicating mild urinary outflow obstructive symptoms.  He indicated that his erectile function is able to complete sexual activity on most attempts.   PHYSICAL EXAM: This patient is in no acute distress.  He is alert and oriented.   vitals were not taken for this visit. He exhibits no respiratory distress or labored breathing.  He appears neurologically intact.  His mood is pleasant.  His affect is appropriate.  Please note the digital rectal exam findings described above.  KPS = 100  100 - Normal; no complaints; no evidence of disease. 90   - Able to carry on normal activity; minor signs or symptoms of disease. 80   - Normal activity with effort; some signs or symptoms of disease. 32   - Cares for self; unable  to carry on normal activity or to do active work. 60   - Requires occasional assistance, but is able to care for most of his personal needs. 50   - Requires considerable assistance and frequent medical care. 59   - Disabled; requires special care and assistance. 74   - Severely disabled; hospital admission is indicated although death not imminent. 89   - Very sick; hospital admission necessary; active supportive treatment necessary. 10   -  Moribund; fatal processes progressing rapidly. 0     - Dead  Karnofsky DA, Abelmann [place], Craver LS and Burchenal [facility] [phone]) The use of the nitrogen mustards in the palliative treatment of carcinoma: with particular reference to bronchogenic carcinoma Cancer 1 634-56   LABORATORY DATA:  Lab Results  Component Value Date   WBC 3.4* 08/14/2012   HGB 14.8 08/14/2012   HCT 43.1 08/14/2012   MCV 96.1 08/14/2012   PLT 296.0 08/14/2012   Lab Results  Component Value Date   NA 139 10/15/2013   K 4.6 10/15/2013   CL 102 10/15/2013   CO2 28 10/15/2013   Lab Results  Component Value Date   ALT 21 10/15/2013   AST 26 10/15/2013   ALKPHOS 81 10/15/2013   BILITOT 1.2 10/15/2013     RADIOGRAPHY: No results found.    IMPRESSION: This gentleman is a 73 y.o. 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  His T-Stage, Gleason's Score, and PSA put him into the intermediate risk group.  Accordingly he is eligible for a variety of potential treatment options including prostatectomy, external radiotherapy or possibly prostate seed implant as monotherapy, given the low volume Gleason's 7 disease with a primary grade of 3 rather than 4.  While it represents an excellent option for low risk disease, active surveillance is not recommended by the NCCN for patients with intermediate risk disease.  PLAN: Today I reviewed the findings and workup thus far.  We discussed the natural history of prostate cancer.  We reviewed the the implications of T-stage, Gleason's Score, and PSA on decision-making and outcomes in prostate cancer.  We discussed radiation treatment in the management of prostate cancer with regard to the logistics and delivery of external beam radiation treatment as well as the logistics and delivery of prostate brachytherapy.  We compared and contrasted each of these approaches and also compared these against prostatectomy.  The patient expressed some  interest in prostate brachytherapy, but, had some hesitation about having the procedure prior to a trip to [facility] in December to visit his grandson.  He is considering proceeding with seed implant, and determining if he is comfortable with that and how it might affect his travel plans.  The prostate volume is greater than 60 cc, but, the patient has a low IPSS score.  He would likely tolerate brachytherapy well.  I wonder if he may benefit from some downsizing of the gland with 5-ARI prior to or concomitant with seed implant.  I will share my findings with Dr. Jeffie Pollock and wait to hear back if the patient desires to proceed with seed implant.     I enjoyed meeting with him today, and will look forward to participating in the care of this very nice gentleman.   I spent 60 minutes face to face with the patient and more than 50% of that time was spent in counseling and/or coordination of care.   ------------------------------------------------  Sheral Apley. Tammi Klippel,  M.D.

## 2014-03-09 ENCOUNTER — Ambulatory Visit
Admission: RE | Admit: 2014-03-09 | Discharge: 2014-03-09 | Disposition: A | Discharge status: Home / Self Care | Payer: Medicare Other | Source: Ambulatory Visit | Attending: Radiation Oncology | Admitting: Radiation Oncology

## 2014-03-09 ENCOUNTER — Encounter: Payer: Self-pay | Admitting: Radiation Oncology

## 2014-03-09 VITALS — BP 137/66 | HR 75 | Temp 98.0°F | Resp 16 | Ht 70.0 in | Wt 150.0 lb

## 2014-03-09 DIAGNOSIS — C61 Malignant neoplasm of prostate: Secondary | ICD-10-CM

## 2014-03-09 DIAGNOSIS — Z860100 Personal history of colon polyps, unspecified: Secondary | ICD-10-CM | POA: Insufficient documentation

## 2014-03-09 DIAGNOSIS — I10 Essential (primary) hypertension: Secondary | ICD-10-CM | POA: Diagnosis not present

## 2014-03-09 DIAGNOSIS — Z510 Encounter for antineoplastic radiation therapy: Secondary | ICD-10-CM | POA: Insufficient documentation

## 2014-03-09 NOTE — Progress Notes (Signed)
Reports nocturia 1. Denies dysuria or hematuria. Reports strong steady urine stream. Denies difficulty emptying his bladder. Denies incontinence or leakage. Denies night sweats. Denies weight loss. Denies pain. Patient is a very active runner.

## 2014-03-09 NOTE — Progress Notes (Signed)
See progress noted under physician encounter.  

## 2014-03-19 ENCOUNTER — Telehealth: Payer: Self-pay | Admitting: Radiation Oncology

## 2014-03-19 ENCOUNTER — Telehealth: Payer: Self-pay | Admitting: *Deleted

## 2014-03-19 ENCOUNTER — Other Ambulatory Visit: Payer: Self-pay | Admitting: Urology

## 2014-03-19 NOTE — Telephone Encounter (Signed)
Received fax message from [facility], CSR that this patient called after hours yesterday. Phoned patient to inquire. Patient reports he is ready to have his chest xray and ekg for his implant. Transferred call to Romie Jumper who will make necessary arrangements.

## 2014-03-19 NOTE — Telephone Encounter (Signed)
CALLED PATIENT TO INFORM OF IMPLANT DATE, SPOKE WITH PATIENT AND HE IS AWARE OF THIS IMPLANT 

## 2014-03-26 ENCOUNTER — Telehealth: Payer: Self-pay | Admitting: *Deleted

## 2014-03-26 NOTE — Telephone Encounter (Signed)
CALLED PATIENT TO REMIND OF PRE-SEED APPT. FOR 03-27-14 @ 10:00 AM, SPOKE WITH PATIENT AND HE IS AWARE OF THIS APPT.

## 2014-03-27 ENCOUNTER — Encounter: Payer: Self-pay | Admitting: Radiation Oncology

## 2014-03-27 ENCOUNTER — Ambulatory Visit
Admission: RE | Admit: 2014-03-27 | Discharge: 2014-03-27 | Disposition: A | Discharge status: Home / Self Care | Payer: Medicare Other | Source: Ambulatory Visit | Attending: Radiation Oncology | Admitting: Radiation Oncology

## 2014-03-27 DIAGNOSIS — C61 Malignant neoplasm of prostate: Secondary | ICD-10-CM | POA: Diagnosis not present

## 2014-03-27 DIAGNOSIS — Z510 Encounter for antineoplastic radiation therapy: Secondary | ICD-10-CM | POA: Insufficient documentation

## 2014-03-27 NOTE — Progress Notes (Signed)
  Radiation Oncology         [phone] ________________________________  Name: JACOBB ALEN MRN: [id number]  Date: 03/27/2014  DOB: 1941-06-08  SIMULATION AND TREATMENT PLANNING NOTE PUBIC ARCH STUDY  LV:DIXVEZ,BMZTA Mallie Mussel, MD  [name], MD  DIAGNOSIS: 73 y.o. 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 He was brought to the radiation planning suite and placed supine on the CT couch. A 3-dimensional image study set was obtained in upload to the planning computer. There, on each axial slice, I contoured the prostate gland. Then, using three-dimensional radiation planning tools I reconstructed the prostate in view of the structures from the transperineal needle pathway to assess for possible pubic arch interference. In doing so, I did not appreciate any pubic arch interference. Also, the patient's prostate volume was estimated based on the drawn structure. The volume was 50 cc.  Given the pubic arch appearance and prostate volume, patient remains a good candidate to proceed with prostate seed implant. Today, he freely provided informed written consent to proceed.    PLAN: The patient will undergo prostate seed implant.   ________________________________  Sheral Apley. Tammi Klippel, M.D.

## 2014-03-30 ENCOUNTER — Ambulatory Visit (HOSPITAL_BASED_OUTPATIENT_CLINIC_OR_DEPARTMENT_OTHER)
Admission: RE | Admit: 2014-03-30 | Discharge: 2014-03-30 | Disposition: A | Discharge status: Home / Self Care | Payer: Medicare Other | Source: Ambulatory Visit | Attending: Urology | Admitting: Urology

## 2014-03-30 ENCOUNTER — Encounter (HOSPITAL_BASED_OUTPATIENT_CLINIC_OR_DEPARTMENT_OTHER)
Admission: RE | Admit: 2014-03-30 | Discharge: 2014-03-30 | Disposition: A | Discharge status: Home / Self Care | Payer: Medicare Other | Source: Ambulatory Visit | Attending: Urology | Admitting: Urology

## 2014-03-30 DIAGNOSIS — C61 Malignant neoplasm of prostate: Secondary | ICD-10-CM | POA: Diagnosis present

## 2014-03-30 DIAGNOSIS — I10 Essential (primary) hypertension: Secondary | ICD-10-CM | POA: Insufficient documentation

## 2014-05-06 ENCOUNTER — Telehealth: Payer: Self-pay | Admitting: *Deleted

## 2014-05-06 NOTE — Telephone Encounter (Signed)
CALLED PATIENT TO REMIND OF BLOOD WORK FOR 05-07-14, SPOKE WITH PATIENT AND HE IS AWARE OF THIS APPT.

## 2014-05-07 DIAGNOSIS — Z510 Encounter for antineoplastic radiation therapy: Secondary | ICD-10-CM | POA: Diagnosis not present

## 2014-05-07 DIAGNOSIS — C61 Malignant neoplasm of prostate: Secondary | ICD-10-CM | POA: Diagnosis not present

## 2014-05-07 DIAGNOSIS — I4510 Unspecified right bundle-branch block: Secondary | ICD-10-CM | POA: Diagnosis not present

## 2014-05-07 DIAGNOSIS — I10 Essential (primary) hypertension: Secondary | ICD-10-CM | POA: Diagnosis not present

## 2014-05-07 DIAGNOSIS — E785 Hyperlipidemia, unspecified: Secondary | ICD-10-CM | POA: Diagnosis not present

## 2014-05-07 LAB — COMPREHENSIVE METABOLIC PANEL
ALBUMIN: 3.7 g/dL (ref 3.5–5.2)
ALT: 15 U/L (ref 0–53)
AST: 19 U/L (ref 0–37)
Alkaline Phosphatase: 98 U/L (ref 39–117)
Anion gap: 11 (ref 5–15)
BUN: 15 mg/dL (ref 6–23)
CALCIUM: 9.5 mg/dL (ref 8.4–10.5)
CHLORIDE: 98 meq/L (ref 96–112)
CO2: 27 mEq/L (ref 19–32)
Creatinine, Ser: 1.06 mg/dL (ref 0.50–1.35)
GFR calc Af Amer: 78 mL/min — ABNORMAL LOW (ref >90)
GFR calc non Af Amer: 68 mL/min — ABNORMAL LOW (ref >90)
Glucose, Bld: 107 mg/dL — ABNORMAL HIGH (ref 70–99)
Potassium: 4.5 mEq/L (ref 3.7–5.3)
SODIUM: 136 meq/L — AB (ref 137–147)
Total Bilirubin: 0.7 mg/dL (ref 0.3–1.2)
Total Protein: 7 g/dL (ref 6.0–8.3)

## 2014-05-07 LAB — CBC
HCT: 41.4 % (ref 39.0–52.0)
Hemoglobin: 14.4 g/dL (ref 13.0–17.0)
MCH: 32.1 pg (ref 26.0–34.0)
MCHC: 34.8 g/dL (ref 30.0–36.0)
MCV: 92.2 fL (ref 78.0–100.0)
PLATELETS: 270 10*3/uL (ref 150–400)
RBC: 4.49 MIL/uL (ref 4.22–5.81)
RDW: 13.1 % (ref 11.5–15.5)
WBC: 4.1 10*3/uL (ref 4.0–10.5)

## 2014-05-07 LAB — APTT: aPTT: 29 seconds (ref 24–37)

## 2014-05-07 LAB — PROTIME-INR
INR: 0.96 (ref 0.00–1.49)
Prothrombin Time: 12.9 seconds (ref 11.6–15.2)

## 2014-05-08 ENCOUNTER — Encounter (HOSPITAL_BASED_OUTPATIENT_CLINIC_OR_DEPARTMENT_OTHER): Payer: Self-pay | Admitting: *Deleted

## 2014-05-08 NOTE — Progress Notes (Signed)
NPO AFTER MN. ARRIVE AT 0800. CURRENT LAB RESULTS , EKG , AND CXR IN CHART AND EPIC. WILL DO FLEET ENEMA AM DOS.

## 2014-05-13 NOTE — H&P (Signed)
  Reason For Visit Seen today for a pre-op visit.   Active Problems Problems  1. Preop examination (I69.629)   Assessed By: Jimmey Ralph (Urology); Last Assessed: 07 May 2014 2. Prostate cancer (C61)   Assessed By: Jimmey Ralph (Urology); Last Assessed: 07 May 2014  History of Present Illness 73 YO male patient of Dr. Ralene Muskrat seen today for a pre-op visit. Scheduled for I-125 seed implants 05/14/14 for PCa.    GU Hx:  Hx of prostate biopsy done for a PSA of 4.76 and a right apical nodule. His prostate volume is 25ml.  The biopsy showed a T2a Nx Mx Gleason 7(3+4) in 10% of the right mid medial core and Gleason 6 in 10% of the right apical lateral core. There was a single core of atypia at the right base.  His CAPRA score is 2.     Nov 2015 Interval Hx:   Today denies CP, SOB, cough, or f/c.   Past Medical History Problems  1. History of hyperlipidemia (Z86.39) 2. History of hypertension (Z86.79) 3. History of right bundle branch block (RBBB) (Z98.89)  Surgical History Problems  1. History of Tonsillectomy  Current Meds 1. Levofloxacin 500 MG Oral Tablet; Take one tablet daily starting day before procedure;  Therapy: [phone] to (Evaluate:02Jul2015)  Requested for: [id number]; Last  Rx:29Jun2015 Ordered 2. Lisinopril 20 MG Oral Tablet;  Therapy: (Recorded:29Jun2015) to Recorded  Allergies Medication  1. No Known Drug Allergies  Family History Problems  1. Family history of Aneurysm : Grandparent 2. Family history of Deceased : Father 3. Family history of macular degeneration (U44.034) : Mother  Social History Problems  1. Caffeine use (F15.90) 2. Married 3. Never a smoker 4. Number of children 5. Retired 57. Social alcohol use (F10.99)  Review of Systems Genitourinary, constitutional, skin, eye, otolaryngeal, hematologic/lymphatic, cardiovascular, pulmonary, endocrine, musculoskeletal, gastrointestinal, neurological and psychiatric system(s) were  reviewed and pertinent findings if present are noted and are otherwise negative.    Vitals Vital Signs [Data Includes: Last 1 Day]  Recorded: [id number] 11:48AM  Blood Pressure: 152 / 78 Temperature: 97.9 F Heart Rate: 71  Physical Exam Constitutional: Well nourished and well developed . No acute distress. The patient appears well hydrated.  ENT:. The ears and nose are normal in appearance.  Neck: The appearance of the neck is normal.  Pulmonary: No respiratory distress.  Cardiovascular: Heart rate and rhythm are normal.  Abdomen: The abdomen is flat. The abdomen is soft and nontender. No suprapubic tenderness.  Skin: Normal skin turgor and normal skin color and pigmentation.  Neuro/Psych:. Mood and affect are appropriate.    Results/Data Urine [Data Includes: Last 1 Day]   [id number]  COLOR YELLOW   APPEARANCE CLEAR   SPECIFIC GRAVITY 1.020   pH 5.5   GLUCOSE NEG mg/dL  BILIRUBIN NEG   KETONE NEG mg/dL  BLOOD NEG   PROTEIN NEG mg/dL  UROBILINOGEN 0.2 mg/dL  NITRITE NEG   LEUKOCYTE ESTERASE NEG    The following clinical lab reports were reviewed:  UA- negative.    Plan Health Maintenance  1. UA With REFLEX; [Do Not Release]; Status:Complete;   Done: [id number] 11:31AM  Cleared to proceed with seed implant 05/14/14.

## 2014-05-14 ENCOUNTER — Ambulatory Visit (HOSPITAL_BASED_OUTPATIENT_CLINIC_OR_DEPARTMENT_OTHER): Payer: Medicare Other | Admitting: Anesthesiology

## 2014-05-14 ENCOUNTER — Encounter (HOSPITAL_BASED_OUTPATIENT_CLINIC_OR_DEPARTMENT_OTHER): Payer: Self-pay | Admitting: Anesthesiology

## 2014-05-14 ENCOUNTER — Encounter (HOSPITAL_BASED_OUTPATIENT_CLINIC_OR_DEPARTMENT_OTHER)
Admission: RE | Disposition: A | Discharge status: Home / Self Care | Payer: Self-pay | Source: Ambulatory Visit | Attending: Urology

## 2014-05-14 ENCOUNTER — Ambulatory Visit (HOSPITAL_COMMUNITY): Payer: Medicare Other

## 2014-05-14 ENCOUNTER — Ambulatory Visit (HOSPITAL_BASED_OUTPATIENT_CLINIC_OR_DEPARTMENT_OTHER)
Admission: RE | Admit: 2014-05-14 | Discharge: 2014-05-14 | Disposition: A | Discharge status: Home / Self Care | Payer: Medicare Other | Source: Ambulatory Visit | Attending: Urology | Admitting: Urology

## 2014-05-14 DIAGNOSIS — I4510 Unspecified right bundle-branch block: Secondary | ICD-10-CM | POA: Diagnosis not present

## 2014-05-14 DIAGNOSIS — E785 Hyperlipidemia, unspecified: Secondary | ICD-10-CM | POA: Insufficient documentation

## 2014-05-14 DIAGNOSIS — I10 Essential (primary) hypertension: Secondary | ICD-10-CM | POA: Diagnosis not present

## 2014-05-14 DIAGNOSIS — C61 Malignant neoplasm of prostate: Secondary | ICD-10-CM | POA: Diagnosis not present

## 2014-05-14 HISTORY — DX: Presence of spectacles and contact lenses: Z97.3

## 2014-05-14 HISTORY — DX: Cardiac murmur, unspecified: R01.1

## 2014-05-14 HISTORY — PX: RADIOACTIVE SEED IMPLANT: SHX5150

## 2014-05-14 SURGERY — INSERTION, RADIATION SOURCE, PROSTATE
Anesthesia: General | Site: Prostate

## 2014-05-14 MED ORDER — ACETAMINOPHEN 325 MG PO TABS
650.0000 mg | ORAL_TABLET | ORAL | Status: DC | PRN
  Filled 2014-05-14: qty 2

## 2014-05-14 MED ORDER — OXYCODONE HCL 5 MG/5ML PO SOLN
5.0000 mg | Freq: Once | ORAL | Status: DC | PRN
  Filled 2014-05-14: qty 5

## 2014-05-14 MED ORDER — EPHEDRINE SULFATE 50 MG/ML IJ SOLN: INTRAMUSCULAR | Status: DC | PRN

## 2014-05-14 MED ORDER — EPHEDRINE SULFATE 50 MG/ML IJ SOLN
INTRAMUSCULAR | Status: DC | PRN
  Administered 2014-05-14 (×5): 10 mg via INTRAVENOUS

## 2014-05-14 MED ORDER — BELLADONNA ALKALOIDS-OPIUM 16.2-60 MG RE SUPP
RECTAL | Status: DC | PRN
  Administered 2014-05-14: 1 via RECTAL

## 2014-05-14 MED ORDER — LACTATED RINGERS IV SOLN
INTRAVENOUS | Status: DC
  Administered 2014-05-14 (×3): via INTRAVENOUS
  Filled 2014-05-14: qty 1000

## 2014-05-14 MED ORDER — IOHEXOL 350 MG/ML SOLN
INTRAVENOUS | Status: DC | PRN
  Administered 2014-05-14: 7 mL

## 2014-05-14 MED ORDER — FENTANYL CITRATE 0.05 MG/ML IJ SOLN
INTRAMUSCULAR | Status: DC | PRN
  Administered 2014-05-14 (×6): 12.5 ug via INTRAVENOUS
  Administered 2014-05-14: 50 ug via INTRAVENOUS
  Administered 2014-05-14 (×2): 12.5 ug via INTRAVENOUS
  Administered 2014-05-14 (×2): 25 ug via INTRAVENOUS

## 2014-05-14 MED ORDER — MEPERIDINE HCL 25 MG/ML IJ SOLN
6.2500 mg | INTRAMUSCULAR | Status: DC | PRN
  Filled 2014-05-14: qty 1

## 2014-05-14 MED ORDER — DEXAMETHASONE SODIUM PHOSPHATE 4 MG/ML IJ SOLN
INTRAMUSCULAR | Status: DC | PRN
  Administered 2014-05-14: 10 mg via INTRAVENOUS

## 2014-05-14 MED ORDER — PROMETHAZINE HCL 25 MG/ML IJ SOLN
6.2500 mg | INTRAMUSCULAR | Status: DC | PRN
Start: 2014-05-14 — End: 2014-05-14
  Filled 2014-05-14: qty 1

## 2014-05-14 MED ORDER — STERILE WATER FOR IRRIGATION IR SOLN
Status: DC | PRN
  Administered 2014-05-14: 3 mL

## 2014-05-14 MED ORDER — SODIUM CHLORIDE 0.9 % IV SOLN
250.0000 mL | INTRAVENOUS | Status: DC | PRN
  Filled 2014-05-14: qty 250

## 2014-05-14 MED ORDER — FLEET ENEMA 7-19 GM/118ML RE ENEM
1.0000 | ENEMA | Freq: Once | RECTAL | Status: DC
  Filled 2014-05-14: qty 1

## 2014-05-14 MED ORDER — PROPOFOL 10 MG/ML IV BOLUS
INTRAVENOUS | Status: DC | PRN
  Administered 2014-05-14: 200 mg via INTRAVENOUS

## 2014-05-14 MED ORDER — OXYCODONE HCL 5 MG PO TABS
5.0000 mg | ORAL_TABLET | ORAL | Status: DC | PRN
  Filled 2014-05-14: qty 2

## 2014-05-14 MED ORDER — OXYCODONE HCL 5 MG PO TABS
5.0000 mg | ORAL_TABLET | Freq: Once | ORAL | Status: DC | PRN
  Filled 2014-05-14: qty 1

## 2014-05-14 MED ORDER — CIPROFLOXACIN IN D5W 400 MG/200ML IV SOLN
400.0000 mg | INTRAVENOUS | Status: AC
  Administered 2014-05-14: 400 mg via INTRAVENOUS
  Filled 2014-05-14: qty 200

## 2014-05-14 MED ORDER — HYDROCODONE-ACETAMINOPHEN 5-325 MG PO TABS: 1.0000 | ORAL_TABLET | Freq: Four times a day (QID) | ORAL | Status: DC | PRN

## 2014-05-14 MED ORDER — FENTANYL CITRATE 0.05 MG/ML IJ SOLN
INTRAMUSCULAR | Status: AC
  Filled 2014-05-14: qty 6

## 2014-05-14 MED ORDER — ONDANSETRON HCL 4 MG/2ML IJ SOLN
INTRAMUSCULAR | Status: DC | PRN
  Administered 2014-05-14: 4 mg via INTRAVENOUS

## 2014-05-14 MED ORDER — ACETAMINOPHEN 10 MG/ML IV SOLN
INTRAVENOUS | Status: DC | PRN
  Administered 2014-05-14: 1000 mg via INTRAVENOUS

## 2014-05-14 MED ORDER — CIPROFLOXACIN HCL 500 MG PO TABS: 500.0000 mg | ORAL_TABLET | Freq: Two times a day (BID) | ORAL | Status: DC

## 2014-05-14 MED ORDER — SODIUM CHLORIDE 0.9 % IJ SOLN
3.0000 mL | Freq: Two times a day (BID) | INTRAMUSCULAR | Status: DC
  Filled 2014-05-14: qty 3

## 2014-05-14 MED ORDER — HYDROMORPHONE HCL 1 MG/ML IJ SOLN
0.2500 mg | INTRAMUSCULAR | Status: DC | PRN
  Filled 2014-05-14: qty 1

## 2014-05-14 MED ORDER — BELLADONNA ALKALOIDS-OPIUM 16.2-60 MG RE SUPP
RECTAL | Status: AC
  Filled 2014-05-14: qty 1

## 2014-05-14 MED ORDER — CIPROFLOXACIN IN D5W 400 MG/200ML IV SOLN
INTRAVENOUS | Status: AC
  Filled 2014-05-14: qty 200

## 2014-05-14 MED ORDER — SODIUM CHLORIDE 0.9 % IJ SOLN
3.0000 mL | INTRAMUSCULAR | Status: DC | PRN
  Filled 2014-05-14: qty 3

## 2014-05-14 MED ORDER — STERILE WATER FOR IRRIGATION IR SOLN
Status: DC | PRN
  Administered 2014-05-14: 3000 mL

## 2014-05-14 MED ORDER — LIDOCAINE HCL (CARDIAC) 20 MG/ML IV SOLN
INTRAVENOUS | Status: DC | PRN
  Administered 2014-05-14: 60 mg via INTRAVENOUS

## 2014-05-14 MED ORDER — ACETAMINOPHEN 650 MG RE SUPP
650.0000 mg | RECTAL | Status: DC | PRN
  Filled 2014-05-14: qty 1

## 2014-05-14 MED ORDER — FENTANYL CITRATE 0.05 MG/ML IJ SOLN
25.0000 ug | INTRAMUSCULAR | Status: DC | PRN
Start: 2014-05-14 — End: 2014-05-14
  Filled 2014-05-14: qty 1

## 2014-05-14 SURGICAL SUPPLY — 23 items
BAG URINE DRAINAGE (UROLOGICAL SUPPLIES) ×3 IMPLANT
BLADE CLIPPER SURG (BLADE) ×3 IMPLANT
CATH FOLEY 2WAY SLVR  5CC 16FR (CATHETERS) ×2
CATH FOLEY 2WAY SLVR 5CC 16FR (CATHETERS) ×1 IMPLANT
CATH ROBINSON RED A/P 20FR (CATHETERS) ×3 IMPLANT
CLOTH BEACON ORANGE TIMEOUT ST (SAFETY) ×3 IMPLANT
COVER MAYO STAND STRL (DRAPES) ×3 IMPLANT
COVER TABLE BACK 60X90 (DRAPES) ×3 IMPLANT
DRSG TEGADERM 4X4.75 (GAUZE/BANDAGES/DRESSINGS) ×3 IMPLANT
DRSG TEGADERM 8X12 (GAUZE/BANDAGES/DRESSINGS) ×3 IMPLANT
GLOVE BIO SURGEON STRL SZ7.5 (GLOVE) ×12 IMPLANT
GLOVE ECLIPSE 8.0 STRL XLNG CF (GLOVE) IMPLANT
GLOVE SURG SS PI 8.0 STRL IVOR (GLOVE) ×6 IMPLANT
GOWN PREVENTION PLUS LG XLONG (DISPOSABLE) ×3 IMPLANT
GOWN STRL REIN XL XLG (GOWN DISPOSABLE) ×3 IMPLANT
HOLDER FOLEY CATH W/STRAP (MISCELLANEOUS) ×3 IMPLANT
PACK CYSTO (CUSTOM PROCEDURE TRAY) ×3 IMPLANT
SPONGE GAUZE 4X4 12PLY STER LF (GAUZE/BANDAGES/DRESSINGS) ×2 IMPLANT
SYRINGE 10CC LL (SYRINGE) ×3 IMPLANT
UNDERPAD 30X30 INCONTINENT (UNDERPADS AND DIAPERS) ×6 IMPLANT
WATER STERILE IRR 3000ML UROMA (IV SOLUTION) ×3 IMPLANT
WATER STERILE IRR 500ML POUR (IV SOLUTION) ×3 IMPLANT
radioactove seeds ×174 IMPLANT

## 2014-05-14 NOTE — Interval H&P Note (Signed)
History and Physical Interval Note:  05/14/2014 8:45 AM  Jerry Carroll  has presented today for surgery, with the diagnosis of PROSTATE CANCER  The various methods of treatment have been discussed with the patient and family. After consideration of risks, benefits and other options for treatment, the patient has consented to  Procedure(s): RADIOACTIVE SEED IMPLANT (N/A) as a surgical intervention .  The patient's history has been reviewed, patient examined, no change in status, stable for surgery.  I have reviewed the patient's chart and labs.  Questions were answered to the patient's satisfaction.     Malikhi Ogan J

## 2014-05-14 NOTE — Transfer of Care (Signed)
Immediate Anesthesia Transfer of Care Note  Patient: Jerry Carroll  Procedure(s) Performed: Procedure(s) (LRB): RADIOACTIVE SEED IMPLANT (N/A)  Patient Location: PACU  Anesthesia Type: General  Level of Consciousness: drowsy  Airway & Oxygen Therapy: Patient Spontanous Breathing and Patient connected to face mask oxygen  Post-op Assessment: Report given to PACU RN and Post -op Vital signs reviewed and stable  Post vital signs: Reviewed and stable  Complications: No apparent anesthesia complications

## 2014-05-14 NOTE — Procedures (Signed)
  Radiation Oncology         [phone] ________________________________  Name: Jerry Carroll MRN: [id number]  Date: 05/14/2014  DOB: 1940/08/27       Prostate Seed Implant  FV:[id number],PRFFM Jerry Mussel, MD  No ref. provider found  DIAGNOSIS: 73 y.o. 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 Range 05/02/2010  05/26/2011 08/14/2012  10/15/2013  11/19/2013   PSA Latest Range: 0.10-4.00 ng/mL 3.40 3.10 3.77 4.76 (H) 4.33 (H)       Biopsy Map    Pubic Arch Study        ICD-9-CM ICD-10-CM   1. Malignant neoplasm of prostate 185 C61     PROCEDURE: Insertion of radioactive I-125 seeds into the prostate gland.  RADIATION DOSE: 145 Gy, definitive therapy.  TECHNIQUE: [name] was brought to the operating room with the urologist. He was placed in the dorsolithotomy position. He was catheterized and a rectal tube was inserted. The perineum was shaved, prepped and draped. The ultrasound probe was then introduced into the rectum to see the prostate gland.  TREATMENT DEVICE: A needle grid was attached to the ultrasound probe stand and anchor needles were placed.  3D PLANNING: The prostate was imaged in 3D using a sagittal sweep of the prostate probe. These images were transferred to the planning computer. There, the prostate, urethra and rectum were defined on each axial reconstructed image. Then, the software created an optimized 3D plan and a few seed positions were adjusted. The quality of the plan was reviewed using [facility] information for the target and the following two organs at risk:  Urethra and Rectum.  Then the accepted plan was uploaded to the seed Selectron afterloading unit.  PROSTATE VOLUME STUDY:  Using transrectal ultrasound the volume of the prostate was verified to be 67 cc.  SPECIAL TREATMENT PROCEDURE/SUPERVISION AND HANDLING: The Nucletron FIRST system was used  to place the needles under sagittal guidance. A total of 32 needles were used to deposit 87 seeds in the prostate gland. The individual seed activity was 0.546 mCi  COMPLEX SIMULATION: At the end of the procedure, an anterior radiograph of the pelvis was obtained to document seed positioning and count. Cystoscopy was performed to check the urethra and bladder.  MICRODOSIMETRY: At the end of the procedure, the patient was emitting 0.178 mrem/hr at 1 meter. Accordingly, he was considered safe for hospital discharge.  PLAN: The patient will return to the radiation oncology clinic for post implant CT dosimetry in three weeks.   ________________________________  Sheral Apley Tammi Klippel, M.D.

## 2014-05-14 NOTE — Anesthesia Postprocedure Evaluation (Signed)
Anesthesia Post Note  Patient: Jerry Carroll  Procedure(s) Performed: Procedure(s) (LRB): RADIOACTIVE SEED IMPLANT (N/A)  Anesthesia type: General  Patient location: PACU  Post pain: Pain level controlled  Post assessment: Post-op Vital signs reviewed  Last Vitals: BP 130/82 mmHg  Pulse 80  Temp(Src) 36.1 C (Axillary)  Resp 18  Ht 5\' 10"  (1.778 m)  Wt 144 lb (65.318 kg)  BMI 20.66 kg/m2  SpO2 97%  Post vital signs: Reviewed  Level of consciousness: sedated  Complications: No apparent anesthesia complications

## 2014-05-14 NOTE — Op Note (Signed)
PATIENT:  Jerry Carroll  PRE-OPERATIVE DIAGNOSIS:  Adenocarcinoma of the prostate  POST-OPERATIVE DIAGNOSIS:  Same  PROCEDURE:  Procedure(s): 1. I-125 radioactive seed implantation 2. Cystoscopy  SURGEON:  Surgeon(s): Irine Seal MD  Radiation oncologist: Dr. Tyler Pita  ANESTHESIA:  General  EBL:  Minimal  DRAINS: 48 French Foley catheter  INDICATION: Jerry Carroll is a 73 y.o. with Stage T2a, Gleason 7(3+4) prostate cancer who has elected brachytherapy for treatment.  Description of procedure: After informed consent the patient was brought to the major OR, placed on the table and administered general anesthesia. He was then moved to the modified lithotomy position with his perineum perpendicular to the floor. His perineum and genitalia were then sterilely prepped. An official timeout was then performed. A 16 French Foley catheter was then placed in the bladder and filled with dilute contrast, a rectal tube was placed in the rectum and the transrectal ultrasound probe was placed in the rectum and affixed to the stand. He was then sterilely draped.  The sterile grid was installed.   Anchor needles were then placed.   Real time ultrasonography was used along with the seed planning software spot-pro version 3.1-00. This was used to develop the seed plan including the number of needles as well as number of seeds required for complete and adequate coverage. Real-time ultrasonography was then used along with the previously developed plan and the Nucletron device to implant a total of 87 seeds using 32 needles for a target dose of 145 Gy. This proceeded without difficulty or complication.  A Foley catheter was then removed as well as the transrectal ultrasound probe and rectal probe. Flexible cystoscopy was then performed using the 17 French flexible scope which revealed a normal urethra throughout its length down to the sphincter which appeared intact. The prostatic urethra was 3 cm with  bilobar hyperplasia with minimal obstruction.   There was a small clot in the prostatic urethra. The bladder was then entered and fully and systematically.  The ureteral orifices were noted to be of normal configuration and position. The mucosa revealed no evidence of tumors. There were also no stones identified within the bladder.  no seeds or spacers were seen and/or removed from the bladder.  The cystoscope was then removed.  The drapes were removed.  The perineum was cleaned and dressed.  He was taken out of the lithotomy position and was awakened and taken to recovery room in stable and satisfactory condition. He tolerated procedure well and there were no intraoperative complications.

## 2014-05-14 NOTE — Anesthesia Procedure Notes (Signed)
Procedure Name: LMA Insertion Date/Time: 05/14/2014 10:15 AM Performed by: Mechele Claude Pre-anesthesia Checklist: Patient identified, Emergency Drugs available, Suction available and Patient being monitored Patient Re-evaluated:Patient Re-evaluated prior to inductionOxygen Delivery Method: Circle System Utilized Preoxygenation: Pre-oxygenation with 100% oxygen Intubation Type: IV induction Ventilation: Mask ventilation without difficulty LMA: LMA inserted LMA Size: 4.0 Number of attempts: 1 Airway Equipment and Method: bite block Placement Confirmation: positive ETCO2 Tube secured with: Tape Dental Injury: Teeth and Oropharynx as per pre-operative assessment

## 2014-05-14 NOTE — Anesthesia Preprocedure Evaluation (Signed)
Anesthesia Evaluation  Patient identified by MRN, date of birth, ID band Patient awake    Reviewed: Allergy & Precautions, H&P , NPO status , Patient's Chart, lab work & pertinent test results  Airway Mallampati: II  TM Distance: >3 FB Neck ROM: Full    Dental no notable dental hx.    Pulmonary neg pulmonary ROS,  breath sounds clear to auscultation  Pulmonary exam normal       Cardiovascular hypertension, Pt. on medications + dysrhythmias + Valvular Problems/Murmurs Rhythm:Regular Rate:Normal     Neuro/Psych negative neurological ROS  negative psych ROS   GI/Hepatic negative GI ROS, Neg liver ROS,   Endo/Other  negative endocrine ROS  Renal/GU negative Renal ROS     Musculoskeletal negative musculoskeletal ROS (+)   Abdominal   Peds  Hematology negative hematology ROS (+)   Anesthesia Other Findings   Reproductive/Obstetrics                             Anesthesia Physical Anesthesia Plan  ASA: II  Anesthesia Plan: General   Post-op Pain Management:    Induction: Intravenous  Airway Management Planned: Oral ETT and LMA  Additional Equipment:   Intra-op Plan:   Post-operative Plan: Extubation in OR  Informed Consent: I have reviewed the patients History and Physical, chart, labs and discussed the procedure including the risks, benefits and alternatives for the proposed anesthesia with the patient or authorized representative who has indicated his/her understanding and acceptance.   Dental advisory given  Plan Discussed with: CRNA  Anesthesia Plan Comments:         Anesthesia Quick Evaluation

## 2014-05-14 NOTE — Discharge Instructions (Signed)
Brachytherapy for Prostate Cancer, Care After Refer to this sheet in the next few weeks. These instructions provide you with information on caring for yourself after your procedure. Your health care provider may also give you more specific instructions. Your treatment has been planned according to current medical practices, but problems sometimes occur. Call your health care provider if you have any problems or questions after your procedure. WHAT TO EXPECT AFTER THE PROCEDURE The area behind the scrotum will probably be tender and bruised. For a short period of time you may have:  Difficulty passing urine. You may need a catheter for a few days to a month.  Blood in the urine or semen.  A feeling of constipation because of prostate swelling.  Frequent feeling of an urgent need to urinate. For a long period of time you may have:  Inflammation of the rectum. This happens in about 2% of people who have the procedure.  Erection problems. These vary with age and occur in about 15-40% of men.  Difficulty urinating. This is caused by scarring in the urethra.  Diarrhea. HOME CARE INSTRUCTIONS   Take medicines only as directed by your health care provider.  You will probably have a catheter in your bladder for several days. You will have blood in the urine bag and should drink a lot of fluids to keep it a light red color.  Keep all follow-up visits as directed by your health care provider. If you have a catheter, it will be removed during one of these visits.  Try not to sit directly on the area behind the scrotum. A soft cushion can decrease the discomfort. Ice packs may also be helpful for the discomfort. Do not put ice directly on the skin.  Shower and wash the area behind the scrotum gently. Do not sit in a tub.  If you have had the brachytherapy that uses the seeds, limit your close contact with children and pregnant women for 2 months because of the radiation still in the prostate.  After that period of time, the levels drop off quickly. SEEK IMMEDIATE MEDICAL CARE IF:   You have a fever.  You have chills.  You have shortness of breath.  You have chest pain.  You have thick blood, like tomato juice, in the urine bag.  Your catheter is blocked so urine cannot get into the bag. Your bladder area or lower abdomen may be swollen.  There is excessive bleeding from your rectum. It is normal to have a little blood mixed with your stool.  There is severe discomfort in the treated area that does not go away with pain medicine.  You have abdominal discomfort.  You have severe nausea or vomiting.  You develop any new or unusual symptoms. Document Released: 07/15/2010 Document Revised: 10/27/2013 Document Reviewed: 12/03/2012 [facility] Patient Information 2015 [place], [place]. This information is not intended to replace advice given to you by your health care provider. Make sure you discuss any questions you have with your health care provider.  Post Anesthesia Home Care Instructions  Activity: Get plenty of rest for the remainder of the day. A responsible adult should stay with you for 24 hours following the procedure.  For the next 24 hours, DO NOT: -Drive a car -Paediatric nurse -Drink alcoholic beverages -Take any medication unless instructed by your physician -Make any legal decisions or sign important papers.  Meals: Start with liquid foods such as gelatin or soup. Progress to regular foods as tolerated. Avoid greasy, spicy,  heavy foods. If nausea and/or vomiting occur, drink only clear liquids until the nausea and/or vomiting subsides. Call your physician if vomiting continues.  Special Instructions/Symptoms: Your throat may feel dry or sore from the anesthesia or the breathing tube placed in your throat during surgery. If this causes discomfort, gargle with warm salt water. The discomfort should disappear within 24 hours.

## 2014-05-15 ENCOUNTER — Encounter (HOSPITAL_BASED_OUTPATIENT_CLINIC_OR_DEPARTMENT_OTHER): Payer: Self-pay | Admitting: Urology

## 2014-06-03 ENCOUNTER — Telehealth: Payer: Self-pay | Admitting: *Deleted

## 2014-06-03 NOTE — Telephone Encounter (Signed)
CALLED PATIENT TO REMIND OF APPTS. FOR 06-04-14, SPOKE WITH PATIENT AND HE IS AWARE OF THESE APPTS.

## 2014-06-04 ENCOUNTER — Encounter: Payer: Self-pay | Admitting: Radiation Oncology

## 2014-06-04 ENCOUNTER — Ambulatory Visit
Admission: RE | Admit: 2014-06-04 | Discharge: 2014-06-04 | Disposition: A | Discharge status: Home / Self Care | Payer: Medicare Other | Source: Ambulatory Visit | Attending: Radiation Oncology | Admitting: Radiation Oncology

## 2014-06-04 VITALS — BP 120/57 | HR 64 | Temp 98.3°F | Resp 12 | Wt 147.3 lb

## 2014-06-04 DIAGNOSIS — C61 Malignant neoplasm of prostate: Secondary | ICD-10-CM

## 2014-06-04 DIAGNOSIS — Z510 Encounter for antineoplastic radiation therapy: Secondary | ICD-10-CM | POA: Diagnosis not present

## 2014-06-04 NOTE — Progress Notes (Signed)
He is currently in no pain. Pt complains of Fatigue.  Reports urinary frequency and slight hesistency.  Pt states they urinate 3 - 4 times per night.  Pt reports soft regular bowel movements everyday.

## 2014-06-04 NOTE — Progress Notes (Signed)
  Radiation Oncology         [phone] ________________________________  Name: Jerry Carroll MRN: [id number]  Date: 06/04/2014  DOB: [birth year]  COMPLEX SIMULATION NOTE  NARRATIVE:  The patient was brought to the [facility] today following prostate seed implantation approximately one month ago.  Identity was confirmed.  All relevant records and images related to the planned course of therapy were reviewed.  Then, the patient was set-up supine.  CT images were obtained.  The CT images were loaded into the planning software.  Then the prostate and rectum were contoured.  Treatment planning then occurred.  The implanted iodine 125 seeds were identified by the physics staff for projection of radiation distribution  I have requested : 3D Simulation  I have requested a DVH of the following structures: Prostate and rectum.    ________________________________  Sheral Apley Tammi Klippel, M.D.

## 2014-06-04 NOTE — Progress Notes (Signed)
  Radiation Oncology         [phone] ________________________________  Name: Jerry Carroll MRN: [id number]  Date: 06/04/2014  DOB: [birth year]  Follow-Up Visit Note  CC: [name], MD  Jerry So, MD  Diagnosis:   73 y.o. 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 Malignant neoplasm of prostate 185 C61     Interval Since Last Radiation:  6  weeks  Narrative:  The patient returns today for routine follow-up.  He is complaining of increased urinary frequency and urinary hesitation symptoms. He filled out a questionnaire regarding urinary function today providing and overall IPSS score of 18 characterizing his symptoms as moderate.  His pre-implant score was 2. He denies any bowel symptoms.  ALLERGIES:  has No Known Allergies.  Meds: Current Outpatient Prescriptions  Medication Sig Dispense Refill  . ciprofloxacin (CIPRO) 500 MG tablet Take 1 tablet (500 mg total) by mouth 2 (two) times daily. (Patient not taking: Reported on 06/04/2014) 6 tablet 0  . HYDROcodone-acetaminophen (NORCO) 5-325 MG per tablet Take 1 tablet by mouth every 6 (six) hours as needed for moderate pain. (Patient not taking: Reported on 06/04/2014) 15 tablet 0  . lisinopril (PRINIVIL,ZESTRIL) 20 MG tablet TAKE 1 TABLET EVERY DAY OR AS DIRECTED (Patient taking differently: TAKE 1 TABLET EVERY DAY OR AS DIRECTED--  PT TAKES HALF TABLET IN AM (10MG )) 90 tablet 3   No current facility-administered medications for this encounter.    Physical Findings: The patient is in no acute distress. Patient is alert and oriented.  weight is 147 lb 4.8 oz (66.815 kg). His oral temperature is 98.3 F (36.8 C). His blood pressure is 120/57 and his pulse is 64. His respiration is 12 and oxygen saturation is 100%. .  No significant changes.  Lab Findings: Lab Results  Component Value Date   WBC 4.1 05/07/2014   HGB 14.4  05/07/2014   HCT 41.4 05/07/2014   MCV 92.2 05/07/2014   PLT 270 05/07/2014    Radiographic Findings:  Patient underwent CT imaging in our clinic for post implant dosimetry. The CT appears to demonstrate an adequate distribution of radioactive seeds throughout the prostate gland. There no seeds in her near the rectum. I suspect the final radiation plan and dosimetry will show appropriate coverage of the prostate gland.   Impression: The patient is recovering from the effects of radiation. His urinary symptoms should gradually improve over the next 4-6 months. We talked about this today. He is encouraged by his improvement already and is otherwise please with his outcome.   Plan: Today, I spent time talking to the patient about his prostate seed implant and resolving urinary symptoms. We also talked about long-term follow-up for prostate cancer following seed implant. He understands that ongoing PSA determinations and digital rectal exams will help perform surveillance to rule out disease recurrence. He understands what to expect with his PSA measures. Patient was also educated today about some of the long-term effects from radiation including a small risk for rectal bleeding and possibly erectile dysfunction. We talked about some of the general management approaches to these potential complications. However, I did encourage the patient to contact our office or return at any point if he has questions or concerns related to his previous radiation and prostate cancer.  _____________________________________  Sheral Apley. Tammi Klippel, M.D.

## 2014-06-05 ENCOUNTER — Telehealth: Payer: Self-pay | Admitting: *Deleted

## 2014-06-05 NOTE — Telephone Encounter (Signed)
CALLED PATIENT TO GIVE APPT. ON 07-29-14 @ 9:15 AM WITH DR. Jeffie Pollock, LVM FOR A RETURN CALL

## 2014-09-21 ENCOUNTER — Encounter: Payer: Self-pay | Admitting: Radiation Oncology

## 2014-09-21 ENCOUNTER — Ambulatory Visit
Admission: RE | Admit: 2014-09-21 | Discharge: 2014-09-21 | Disposition: A | Discharge status: Home / Self Care | Payer: Medicare Other | Source: Ambulatory Visit | Attending: Radiation Oncology | Admitting: Radiation Oncology

## 2014-09-21 DIAGNOSIS — C61 Malignant neoplasm of prostate: Secondary | ICD-10-CM | POA: Insufficient documentation

## 2014-09-21 DIAGNOSIS — Z510 Encounter for antineoplastic radiation therapy: Secondary | ICD-10-CM | POA: Diagnosis present

## 2014-11-28 NOTE — Progress Notes (Signed)
  Radiation Oncology         [phone] ________________________________  Name: Jerry Carroll MRN: [id number]  Date: 09/21/2014  DOB: 06-Jun-1941  3D Planning Note   Prostate Brachytherapy Post-Implant Dosimetry  Diagnosis: 74 y.o. 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 He underwent CT scan complex simulation to delineate the three-dimensional structures of the pelvis and demonstrate the radiation distribution.  Since that time, the seed localization, and complex isodose planning with dose volume histograms have now been completed.  Results:   Prostate Coverage - The dose of radiation delivered to the 90% or more of the prostate gland (D90) was 117.44% of the prescription dose. This exceeds our goal of greater than 90%. Rectal Sparing - The volume of rectal tissue receiving the prescription dose or higher was 0.06 cc. This falls under our thresholds tolerance of 1.0 cc.  Impression: The prostate seed implant appears to show adequate target coverage and appropriate rectal sparing.  Plan:  The patient will continue to follow with urology for ongoing PSA determinations. I would anticipate a high likelihood for local tumor control with minimal risk for rectal morbidity.  ________________________________  Sheral Apley Tammi Klippel, M.D.

## 2014-12-21 ENCOUNTER — Other Ambulatory Visit: Payer: Self-pay

## 2015-06-14 ENCOUNTER — Encounter: Payer: Self-pay | Admitting: Family Medicine

## 2015-06-14 ENCOUNTER — Ambulatory Visit (INDEPENDENT_AMBULATORY_CARE_PROVIDER_SITE_OTHER): Payer: Medicare Other | Admitting: Family Medicine

## 2015-06-14 VITALS — BP 112/62 | HR 64 | Temp 98.4°F | Wt 147.0 lb

## 2015-06-14 DIAGNOSIS — Z23 Encounter for immunization: Secondary | ICD-10-CM

## 2015-06-14 DIAGNOSIS — I10 Essential (primary) hypertension: Secondary | ICD-10-CM

## 2015-06-14 DIAGNOSIS — C61 Malignant neoplasm of prostate: Secondary | ICD-10-CM

## 2015-06-14 DIAGNOSIS — Z86010 Personal history of colonic polyps: Secondary | ICD-10-CM

## 2015-06-14 DIAGNOSIS — E785 Hyperlipidemia, unspecified: Secondary | ICD-10-CM | POA: Diagnosis not present

## 2015-06-14 MED ORDER — LISINOPRIL 20 MG PO TABS: 20.0000 mg | ORAL_TABLET | Freq: Every day | ORAL | Status: DC

## 2015-06-14 NOTE — Assessment & Plan Note (Signed)
S: mild poor control on no medicine.  Lab Results  Component Value Date   CHOL 198 10/15/2013   HDL 75.00 10/15/2013   LDLCALC 109* 10/15/2013   LDLDIRECT 128.5 08/14/2012   TRIG 70.0 10/15/2013   CHOLHDL 3 10/15/2013   A/P: No family or personal history of vascular disease or CAD. HDL very protective at 75 and LDL only mild elevation. If #s on repeat cholesteorl similar- we discussed holding off on statin given so close to primary prevention threshold of 75 per USPTF. Will continue aspirin though.

## 2015-06-14 NOTE — Progress Notes (Signed)
Jerry Reddish, MD Phone: [phone]  Subjective:  Patient presents today to establish care with me as their new primary care provider. Patient was formerly a patient of Dr. Leanne Chang. Chief complaint-noted.   See problem oriented charting- ROS- No chest pain or shortness of breath. No headache or blurry vision.   The following were reviewed and entered/updated in epic: Past Medical History  Diagnosis Date  . Hypertension   . History of colonic polyps   . Prostate cancer ([place])   . Heart murmur     mild -- asymptomatic  . Prostate cancer ([place])     seed implants 2015  . Wears glasses    Patient Active Problem List   Diagnosis Date Noted  . Malignant neoplasm of prostate ([place]) 03/08/2014    Priority: High  . Hyperlipidemia 02/05/2007    Priority: Medium  . Essential hypertension 02/05/2007    Priority: Medium  . BUNDLE BRANCH BLOCK, RIGHT 02/05/2007    Priority: Low  . COLONIC POLYPS, HX OF 02/05/2007    Priority: Low   Past Surgical History  Procedure Laterality Date  . Prostate biopsy    . Tonsillectomy  as child  . Colonoscopy w/ polypectomy  04-27-2004  . Radioactive seed implant N/A 05/14/2014    Procedure: RADIOACTIVE SEED IMPLANT;  Surgeon: [name], MD;  Location: [facility];  Service: Urology;  Laterality: N/A;    Family History  Problem Relation Age of Onset  . Macular degeneration Mother   . Cancer Maternal Uncle     unknown type  . Other Father     62 details were not shared    Medications- reviewed and updated Current Outpatient Prescriptions  Medication Sig Dispense Refill  . aspirin 81 MG tablet Take 81 mg by mouth daily.    Marland Kitchen lisinopril (PRINIVIL,ZESTRIL) 20 MG tablet Take 1 tablet (20 mg total) by mouth daily. 90 tablet 3   No current facility-administered medications for this visit.    Allergies-reviewed and updated No Known Allergies  Social History   Social History  . Marital Status: Married    Spouse Name: N/A    . Number of Children: N/A  . Years of Education: N/A   Social History Main Topics  . Smoking status: Never Smoker   . Smokeless tobacco: Never Used  . Alcohol Use: 1.8 oz/week    3 Glasses of wine per week  . Drug Use: No  . Sexual Activity: Yes   Other Topics Concern  . Not on file   Social History Narrative   Married. 2 children. 5 grandkids. 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 A lot of family time.        ROS--See HPI   Objective: BP 112/62 mmHg  Pulse 64  Temp(Src) 98.4 F (36.9 C)  Wt 147 lb (66.679 kg) Gen: NAD, resting comfortably HEENT: Mucous membranes are moist. Oropharynx normal. Slight rhinorrhea.  Neck: no thyromegaly CV: RRR no murmurs rubs or gallops Lungs: CTAB no crackles, wheeze, rhonchi Abdomen: soft/nontender/nondistended/normal bowel sounds. No rebound or guarding.  Ext: no edema Skin: warm, dry Neuro: grossly normal, moves all extremities, PERRLA  Rectal done at urology  Assessment/Plan:  Malignant neoplasm of prostate ([place]) S: Dr. Tammi Klippel urology- last seen earlier this  month. Seed Implants 05/14/2014. Gleason 7.  A/P: PSA is trending down from over 4 now down to 0.6 range. Will continue to follow closely.    Hyperlipidemia S: mild poor control on no medicine.  Lab Results  Component Value Date   CHOL 198 10/15/2013   HDL 75.00 10/15/2013   LDLCALC 109* 10/15/2013   LDLDIRECT 128.5 08/14/2012   TRIG 70.0 10/15/2013   CHOLHDL 3 10/15/2013   A/P: No family or personal history of vascular disease or CAD. HDL very protective at 75 and LDL only mild elevation. If #s on repeat cholesteorl similar- we discussed holding off on statin given so close to primary prevention threshold of 75 per USPTF. Will continue aspirin though.      Essential hypertension S: controlled. On lisinopirl 10mg  (splits 20mg  pill). Exercises regularly including running and biking  BP Readings from Last 3 Encounters:  06/14/15 112/62  06/04/14 120/57  05/14/14 130/82  A/P:Continue current meds and exercise- doing very well   COLONIC POLYPS, HX OF Polyps removed 2005- no path history. ? Colonoscopy in 2009. Keba checking with [facility] to see about 2009 records- will order repeat if needed.    1 year AWV Return precautions advised.   Future fasting to update labs Orders Placed This Encounter  Procedures  . Pneumococcal conjugate vaccine 13-valent  . CBC    [place]    Standing Status: Future     Number of Occurrences:      Standing Expiration Date: 06/13/2016  . Comprehensive metabolic panel    [place]    Standing Status: Future     Number of Occurrences:      Standing Expiration Date: 06/13/2016    Order Specific Question:  Has the patient fasted?    Answer:  No  . Lipid panel    [place]    Standing Status: Future     Number of Occurrences:      Standing Expiration Date: 06/13/2016    Order Specific Question:  Has the patient fasted?    Answer:  No  . TSH    [place]    Standing Status: Future     Number of Occurrences:      Standing Expiration Date: 06/13/2016    Meds ordered this encounter  Medications  . aspirin 81 MG tablet    Sig: Take 81 mg by mouth daily.  Marland Kitchen lisinopril (PRINIVIL,ZESTRIL) 20 MG tablet    Sig: Take 1 tablet (20 mg total) by mouth daily.    Dispense:  90 tablet    Refill:  3     Health Maintenance Due  Topic Date Due  . PNA vac Low Risk Adult (2 of 2 - PCV13)- given today 05/25/2012

## 2015-06-14 NOTE — Assessment & Plan Note (Signed)
S: controlled. On lisinopirl 10mg  (splits 20mg  pill). Exercises regularly including running and biking  BP Readings from Last 3 Encounters:  06/14/15 112/62  06/04/14 120/57  05/14/14 130/82  A/P:Continue current meds and exercise- doing very well

## 2015-06-14 NOTE — Patient Instructions (Addendum)
[id number] received today.  We are going to see if you had a colonoscopy in 2009 and when your follow up is supposed to be. I am suspicious you may need a repeat soon.   Schedule a lab visit at the front desk- see handout. Return for future fasting labs. Nothing but water after midnight please.   Otherwise things look great- refilled blood pressure medicine for a year and would continue taking aspirin.

## 2015-06-14 NOTE — Assessment & Plan Note (Signed)
S: Dr. Tammi Klippel urology- last seen earlier this month. Seed Implants 05/14/2014. Gleason 7.  A/P: PSA is trending down from over 4 now down to 0.6 range. Will continue to follow closely.

## 2015-06-14 NOTE — Assessment & Plan Note (Signed)
Polyps removed 2005- no path history. ? Colonoscopy in 2009. Keba checking with [facility] to see about 2009 records- will order repeat if needed.

## 2015-07-22 ENCOUNTER — Other Ambulatory Visit: Payer: Self-pay | Admitting: Family Medicine

## 2015-07-22 DIAGNOSIS — Z1211 Encounter for screening for malignant neoplasm of colon: Secondary | ICD-10-CM

## 2015-08-10 ENCOUNTER — Encounter: Payer: Self-pay | Admitting: Family Medicine

## 2015-11-01 IMAGING — CR DG CHEST 2V
2 series · 2 of 2 positions shown · non-contrast
Comparison: None.

CLINICAL DATA: Preop. Patient with prostate carcinoma. History of
hypertension. Nonsmoker.

EXAM:
CHEST  2 VIEW

[w chest pa]
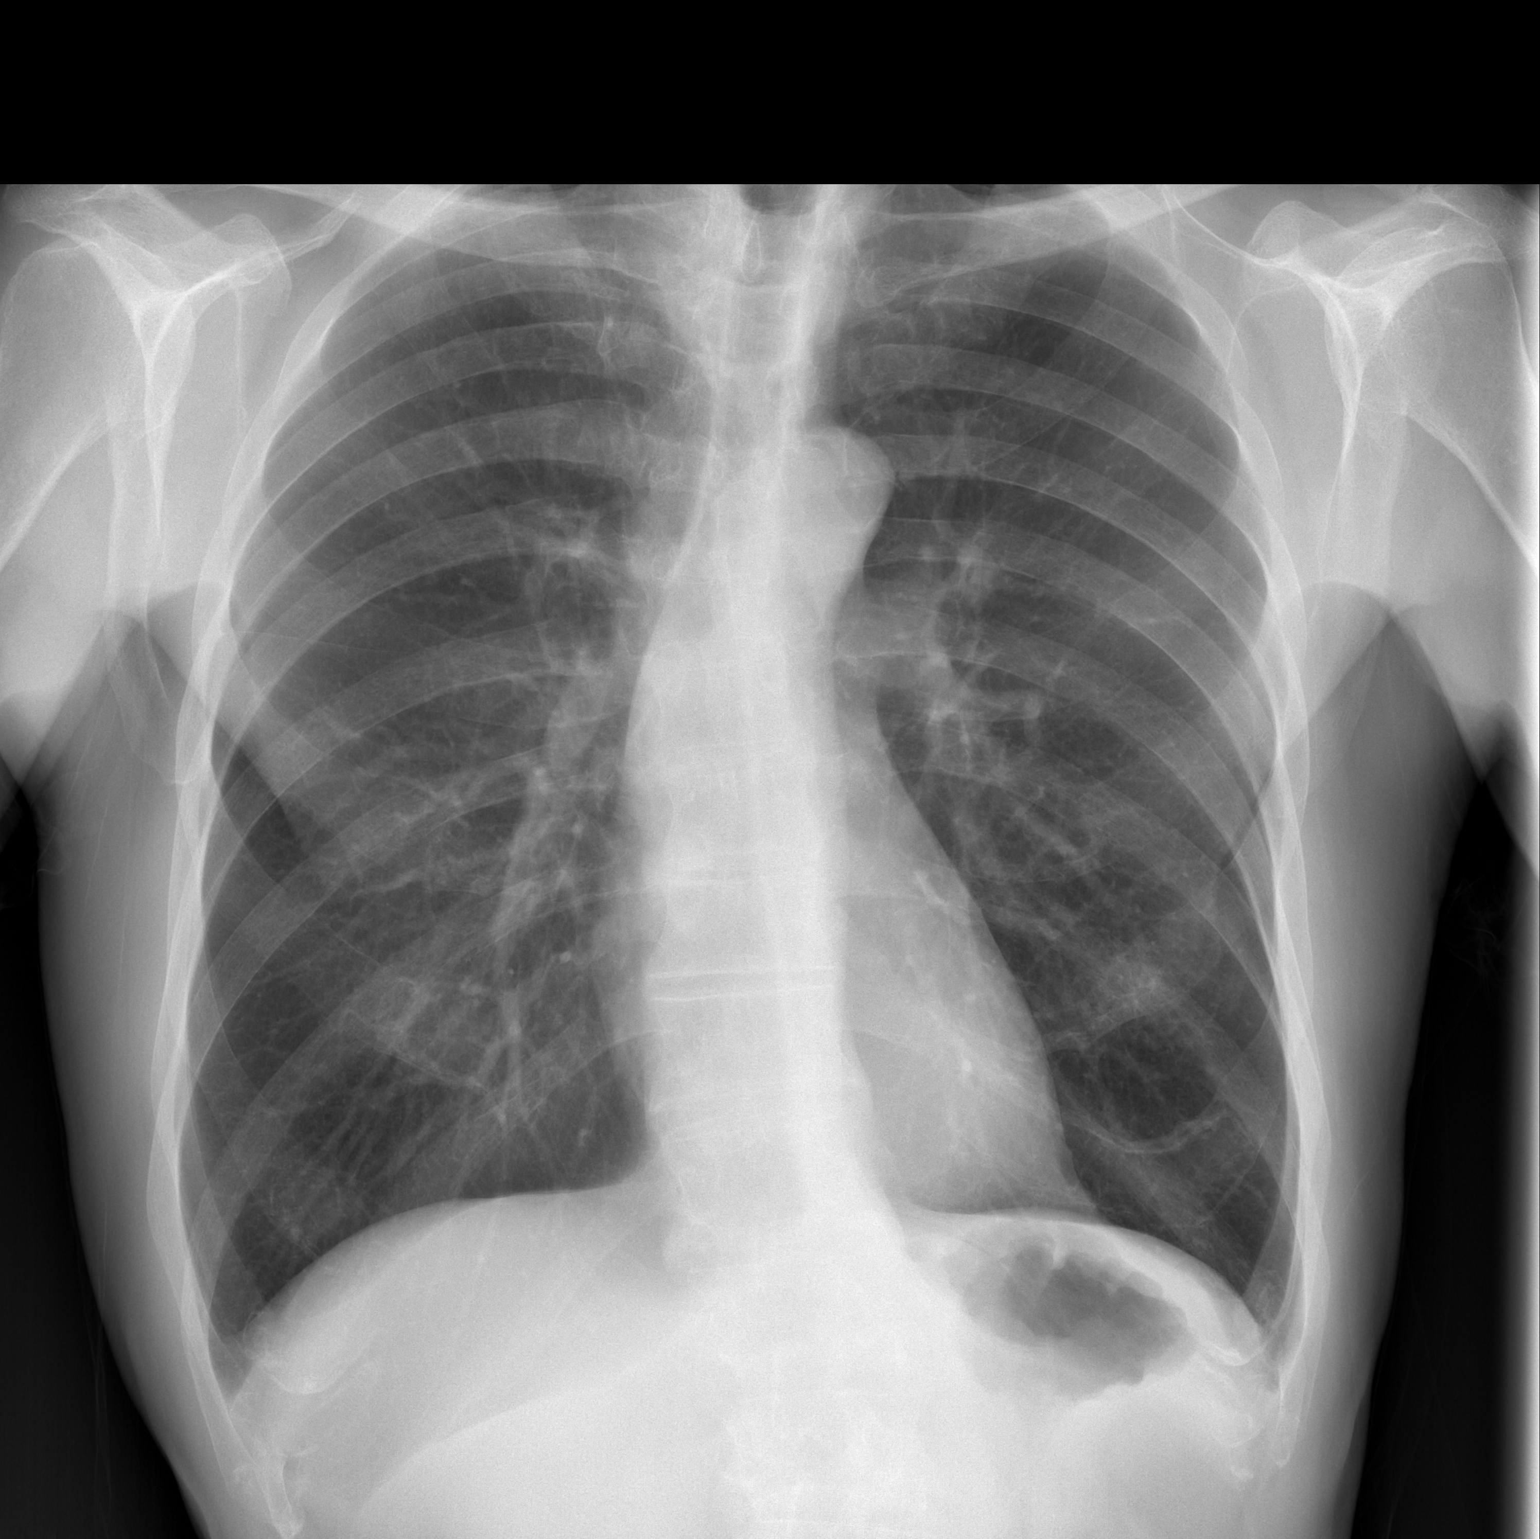

[w chest lat]
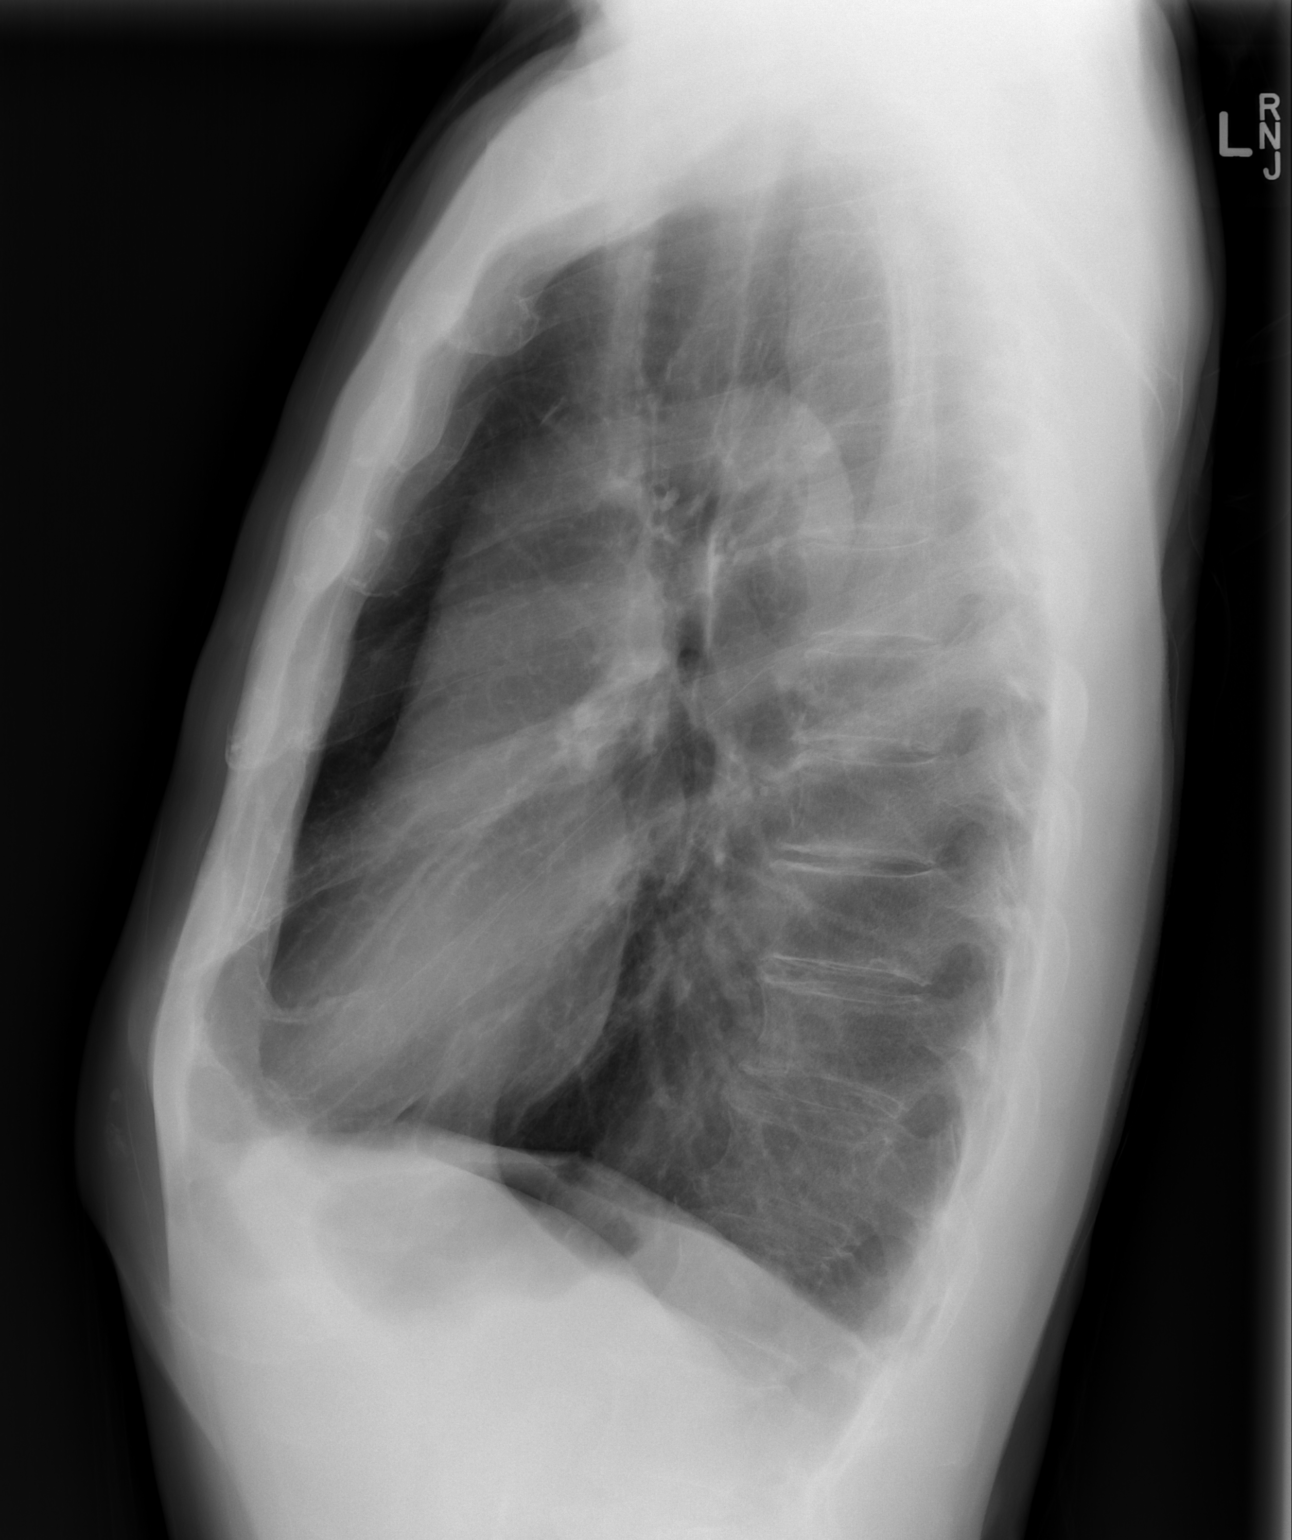

[2 of 2 positions shown; findings below may reference images not displayed]

FINDINGS: Cardiac silhouette is normal in size. Normal mediastinal and hilar
contours.

Lungs are mildly hyperexpanded but clear. No pleural effusion or
pneumothorax.

Bony thorax is intact.  No osteoblastic or osteolytic lesions.
IMPRESSION: No active cardiopulmonary disease.

## 2016-04-14 ENCOUNTER — Ambulatory Visit (INDEPENDENT_AMBULATORY_CARE_PROVIDER_SITE_OTHER): Payer: Medicare Other | Admitting: *Deleted

## 2016-04-14 DIAGNOSIS — Z23 Encounter for immunization: Secondary | ICD-10-CM

## 2016-04-17 ENCOUNTER — Ambulatory Visit: Payer: Medicare Other | Admitting: *Deleted

## 2016-05-26 ENCOUNTER — Ambulatory Visit (INDEPENDENT_AMBULATORY_CARE_PROVIDER_SITE_OTHER): Payer: Medicare Other

## 2016-05-26 DIAGNOSIS — Z23 Encounter for immunization: Secondary | ICD-10-CM | POA: Diagnosis not present

## 2016-05-29 LAB — TB SKIN TEST
INDURATION: 0 mm
TB SKIN TEST: NEGATIVE

## 2016-07-04 ENCOUNTER — Telehealth: Payer: Self-pay | Admitting: Emergency Medicine

## 2016-07-04 ENCOUNTER — Encounter: Payer: Medicare Other | Admitting: Family Medicine

## 2016-07-04 NOTE — Telephone Encounter (Signed)
Dr. Yong Channel attempted to contact patient but no working number was provided. Pt scheduled today for CPE. No call no show.

## 2016-07-04 NOTE — Progress Notes (Deleted)
Phone: [phone]  Subjective:  Patient presents today for their annual wellness visit.    Preventive Screening-Counseling & Management  Smoking Status: ***Never Smoker Second Hand Smoking status: ***No smokers in home  Risk Factors Regular exercise: *** Diet: ***  Fall Risk: ***None  Fall Risk  06/14/2015 03/09/2014 03/09/2014 10/15/2013 08/14/2012  Falls in the past year? No No No No No    Cardiac risk factors:  advanced age (older than 51 for men, 82 for women) *** Hyperlipidemia *** No diabetes. *** Family History: ***   Depression Screen None. PHQ2 0 *** Depression screen [facility] 2/9 06/14/2015 03/09/2014 03/09/2014 10/15/2013 08/14/2012  Decreased Interest 0 0 0 0 0  Down, Depressed, Hopeless 0 0 0 0 0  PHQ - 2 Score 0 0 0 0 0    Activities of Daily Living Independent ADLs and IADLs ***  Hearing Difficulties: ***-patient declines  Cognitive Testing No reported trouble.   Normal 3 word recall  List the Names of Other Physician/Practitioners you currently use: -*** -***  Immunization History  Administered Date(s) Administered  . Influenza Split 04/18/2011  . Influenza Whole 04/24/2007, 03/17/2008, 03/24/2009, 05/02/2010  . Influenza, High Dose Seasonal PF 03/29/2015, 04/14/2016  . Influenza-Unspecified 03/26/2014  . PPD Test 05/26/2016  . Pneumococcal Conjugate-13 06/14/2015  . Pneumococcal Polysaccharide-23 06/26/2005, 05/26/2011  . Tdap 05/26/2011  . Zoster 10/31/2010   Required Immunizations needed today ***  Screening tests- up to date There are no preventive care reminders to display for this patient.  ROS- No pertinent positives discovered in course of AWV  The following were reviewed and entered/updated in epic: Past Medical History:  Diagnosis Date  . Heart murmur    mild -- asymptomatic  . History of colonic polyps   . Hypertension   . Prostate cancer ([place])   . Prostate cancer ([place])    seed implants 2015  . Wears glasses     Patient Active Problem List   Diagnosis Date Noted  . Malignant neoplasm of prostate ([place]) 03/08/2014    Priority: High  . Hyperlipidemia 02/05/2007    Priority: Medium  . Essential hypertension 02/05/2007    Priority: Medium  . BUNDLE BRANCH BLOCK, RIGHT 02/05/2007    Priority: Low  . COLONIC POLYPS, HX OF 02/05/2007    Priority: Low   Past Surgical History:  Procedure Laterality Date  . COLONOSCOPY W/ POLYPECTOMY  04-27-2004  . PROSTATE BIOPSY    . RADIOACTIVE SEED IMPLANT N/A 05/14/2014   Procedure: RADIOACTIVE SEED IMPLANT;  Surgeon: [name], MD;  Location: [facility];  Service: Urology;  Laterality: N/A;  . TONSILLECTOMY  as child    Family History  Problem Relation Age of Onset  . Macular degeneration Mother   . Cancer Maternal Uncle     unknown type  . Other Father     72 details were not shared    Medications- reviewed and updated Current Outpatient Prescriptions  Medication Sig Dispense Refill  . aspirin 81 MG tablet Take 81 mg by mouth daily.    Marland Kitchen lisinopril (PRINIVIL,ZESTRIL) 20 MG tablet Take 1 tablet (20 mg total) by mouth daily. 90 tablet 3   No current facility-administered medications for this visit.     Allergies-reviewed and updated No Known Allergies  Social History   Social History  . Marital status: Married    Spouse name: N/A  . Number of children: N/A  . Years of education: N/A   Social History Main Topics  . Smoking status:  Never Smoker  . Smokeless tobacco: Never Used  . Alcohol use 1.8 oz/week    3 Glasses of wine per week  . Drug use: No  . Sexual activity: Yes   Other Topics Concern  . Not on file   Social History Narrative   Married. 2 children. 5 grandkids. 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 A lot of family time.        Objective: There were no vitals taken for this visit. Gen: NAD, resting comfortably HEENT: Mucous membranes are moist. Oropharynx normal Neck: no thyromegaly CV: RRR no murmurs rubs or gallops Lungs: CTAB no crackles, wheeze, rhonchi Abdomen: soft/nontender/nondistended/normal bowel sounds. No rebound or guarding.  Ext: no edema Skin: warm, dry Neuro: grossly normal, moves all extremities, PERRLA  Assessment/Plan:  AWV completed- discussed recommended screenings anddocumented any personalized health advice and referrals for preventive counseling. See AVS as well which was given to patient.   Status of chronic or acute concerns  *** S: Follows with urology Dr. Tammi Klippel. 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  Lab Results  Component Value Date   PSA 4.33 (H) 11/19/2013   PSA 4.76 (H) 10/15/2013   PSA 3.77 08/14/2012  A/P: discussed repeating test today, *** he already had this at urology though  S: controlled on half of a 20mg  lisinopril 10mg . Still exercising with running and biking  BP Readings from Last 3 Encounters:  06/14/15 112/62  06/04/14 (!) 120/57  05/14/14 130/82  A/P:Continue current meds:  *** doing well  S: mild poorly controlled on no statin. On aspirin alone. No myalgias.  Lab Results  Component Value Date   CHOL 198 10/15/2013   HDL 75.00 10/15/2013   LDLCALC 109 (H) 10/15/2013   LDLDIRECT 128.5 08/14/2012   TRIG 70.0 10/15/2013   CHOLHDL 3 10/15/2013   A/P: update lipids today, calculate 10 year risk. If above 12%, consider statin.   S: polyps removed 2005 but no path history. Last year we looked for records but did not receive any.  A/P: will refer to  GI at this time  Cbc, cmp, lipid, psa? ***     No problem-specific Assessment & Plan notes found for this encounter.   No Follow-up on file.    No orders of the defined types were placed in this encounter.   No orders of the  defined types were placed in this encounter.   Return precautions advised.  Garret Reddish, MD

## 2016-07-31 ENCOUNTER — Ambulatory Visit: Payer: Medicare Other | Attending: Internal Medicine

## 2016-07-31 ENCOUNTER — Other Ambulatory Visit: Payer: Self-pay | Admitting: Internal Medicine

## 2016-07-31 DIAGNOSIS — G319 Degenerative disease of nervous system, unspecified: Secondary | ICD-10-CM | POA: Insufficient documentation

## 2016-07-31 DIAGNOSIS — Q283 Other malformations of cerebral vessels: Secondary | ICD-10-CM | POA: Insufficient documentation

## 2016-07-31 DIAGNOSIS — R413 Other amnesia: Secondary | ICD-10-CM

## 2016-07-31 DIAGNOSIS — G939 Disorder of brain, unspecified: Secondary | ICD-10-CM | POA: Insufficient documentation

## 2016-07-31 MED ORDER — GADOBUTROL 1 MMOL/ML IV SOLN
8.0000 mL | Freq: Once | INTRAVENOUS | Status: AC | PRN
Start: 2016-07-31 — End: 2016-07-31
  Administered 2016-07-31: 8 mmol via INTRAVENOUS
  Filled 2016-07-31: qty 10

## 2016-10-12 ENCOUNTER — Ambulatory Visit
Admission: RE | Admit: 2016-10-12 | Discharge: 2016-10-12 | Disposition: A | Discharge status: Home / Self Care | Payer: Medicare Other | Source: Ambulatory Visit | Attending: Neurology | Admitting: Neurology

## 2016-10-12 DIAGNOSIS — Z0389 Encounter for observation for other suspected diseases and conditions ruled out: Secondary | ICD-10-CM | POA: Insufficient documentation

## 2016-10-12 DIAGNOSIS — R4189 Other symptoms and signs involving cognitive functions and awareness: Secondary | ICD-10-CM | POA: Insufficient documentation

## 2016-10-12 NOTE — Progress Notes (Signed)
EEG COMPLETED. REPORT TO FOLLOW.

## 2016-10-12 NOTE — Procedures (Signed)
Indication / History: The patient has h/o cognitive slowing     This electroencephalogram was recorded using both referential and differential montages. Using a digital machine the 18 channel International 10-20 System of electrode placement was used.    Medications listed include__      Background: The EEG was recorded with the patient awake, drowsy and asleep. The waking background activity in the occipital lobe consists of fairly well developed medium voltage 8-10 hz. activity that attenuates with eye opening. Some eye movement artifact and low voltage fast activity is seen frontally.   Drowsiness heralded by disorganization and slowing of the background.     Abnormal activity : No epileptiform discharges, focal slowing, spike waves or clinical events noted.  Hemispheric asymmetry : none    IMPRESSION: Normal study: This routine EEG recorded during wakefulness and drowsiness was within normal limits for age. There is no evidence of epileptiform activity or slowing.     Rosalio Macadamia, MD - [place]  NEUROLOGY  Available on XTEND paging  Board Certified in Neurology by ABPN  Board Certified Clinical Neurophysiology by ABPN

## 2017-06-30 ENCOUNTER — Emergency Department: Payer: Medicare Other

## 2017-06-30 ENCOUNTER — Emergency Department
Admission: EM | Admit: 2017-06-30 | Discharge: 2017-06-30 | Disposition: A | Discharge status: Home / Self Care | Payer: Medicare Other | Attending: Emergency Medical Services | Admitting: Emergency Medical Services

## 2017-06-30 DIAGNOSIS — G309 Alzheimer's disease, unspecified: Secondary | ICD-10-CM | POA: Insufficient documentation

## 2017-06-30 DIAGNOSIS — R079 Chest pain, unspecified: Secondary | ICD-10-CM | POA: Insufficient documentation

## 2017-06-30 DIAGNOSIS — I10 Essential (primary) hypertension: Secondary | ICD-10-CM | POA: Insufficient documentation

## 2017-06-30 DIAGNOSIS — F0280 Dementia in other diseases classified elsewhere without behavioral disturbance: Secondary | ICD-10-CM | POA: Insufficient documentation

## 2017-06-30 DIAGNOSIS — R4182 Altered mental status, unspecified: Secondary | ICD-10-CM

## 2017-06-30 HISTORY — DX: Essential (primary) hypertension: I10

## 2017-06-30 HISTORY — DX: Unspecified dementia, unspecified severity, without behavioral disturbance, psychotic disturbance, mood disturbance, and anxiety: F03.90

## 2017-06-30 HISTORY — DX: Malignant neoplasm of prostate: C61

## 2017-06-30 HISTORY — DX: Dementia in other diseases classified elsewhere, unspecified severity, without behavioral disturbance, psychotic disturbance, mood disturbance, and anxiety: F02.80

## 2017-06-30 LAB — CBC AND DIFFERENTIAL
Absolute NRBC: 0 10*3/uL
Basophils Absolute Automated: 0.03 10*3/uL (ref 0.00–0.20)
Basophils Automated: 0.4 %
Eosinophils Absolute Automated: 0.01 10*3/uL (ref 0.00–0.70)
Eosinophils Automated: 0.1 %
Hematocrit: 39.9 % — ABNORMAL LOW (ref 42.0–52.0)
Hgb: 13.9 g/dL (ref 13.0–17.0)
Immature Granulocytes Absolute: 0.04 10*3/uL
Immature Granulocytes: 0.5 %
Lymphocytes Absolute Automated: 0.96 10*3/uL (ref 0.50–4.40)
Lymphocytes Automated: 11.6 %
MCH: 31.8 pg (ref 28.0–32.0)
MCHC: 34.8 g/dL (ref 32.0–36.0)
MCV: 91.3 fL (ref 80.0–100.0)
MPV: 9.6 fL (ref 9.4–12.3)
Monocytes Absolute Automated: 0.69 10*3/uL (ref 0.00–1.20)
Monocytes: 8.4 %
Neutrophils Absolute: 6.53 10*3/uL (ref 1.80–8.10)
Neutrophils: 79 %
Nucleated RBC: 0 /100 WBC (ref 0.0–1.0)
Platelets: 263 10*3/uL (ref 140–400)
RBC: 4.37 10*6/uL — ABNORMAL LOW (ref 4.70–6.00)
RDW: 13 % (ref 12–15)
WBC: 8.26 10*3/uL (ref 3.50–10.80)

## 2017-06-30 LAB — URINALYSIS, REFLEX TO MICROSCOPIC EXAM IF INDICATED
Bilirubin, UA: NEGATIVE
Blood, UA: NEGATIVE
Glucose, UA: NEGATIVE
Ketones UA: 20 — AB
Leukocyte Esterase, UA: NEGATIVE
Nitrite, UA: NEGATIVE
Protein, UR: NEGATIVE
Specific Gravity UA: 1.012 (ref 1.001–1.035)
Urine pH: 7 (ref 5.0–8.0)
Urobilinogen, UA: NORMAL mg/dL

## 2017-06-30 LAB — PT AND APTT
PT INR: 1 (ref 0.9–1.1)
PT: 12.9 s (ref 12.6–15.0)
PTT: 28 s (ref 23–37)

## 2017-06-30 LAB — CK: Creatine Kinase (CK): 417 U/L — ABNORMAL HIGH (ref 47–267)

## 2017-06-30 LAB — COMPREHENSIVE METABOLIC PANEL
ALT: 20 U/L (ref 0–55)
AST (SGOT): 29 U/L (ref 5–34)
Albumin/Globulin Ratio: 1.9 (ref 0.9–2.2)
Albumin: 4.2 g/dL (ref 3.5–5.0)
Alkaline Phosphatase: 102 U/L (ref 38–106)
Anion Gap: 12 (ref 5.0–15.0)
BUN: 16 mg/dL (ref 9–28)
Bilirubin, Total: 1 mg/dL (ref 0.2–1.2)
CO2: 23 mEq/L (ref 22–29)
Calcium: 9.8 mg/dL (ref 7.9–10.2)
Chloride: 104 mEq/L (ref 100–111)
Creatinine: 1.2 mg/dL (ref 0.7–1.3)
Globulin: 2.2 g/dL (ref 2.0–3.6)
Glucose: 98 mg/dL (ref 70–100)
Potassium: 4.4 mEq/L (ref 3.5–5.1)
Protein, Total: 6.4 g/dL (ref 6.0–8.3)
Sodium: 139 mEq/L (ref 136–145)

## 2017-06-30 LAB — TROPONIN I: Troponin I: 0.01 ng/mL (ref 0.00–0.09)

## 2017-06-30 LAB — GFR: EGFR: 58.7

## 2017-06-30 NOTE — ED Notes (Signed)
Demented, lost, with police. Resident at the [facility] 120/68 [phone] dexi 106

## 2017-06-30 NOTE — Discharge Instructions (Signed)
Dear [name]:    I appreciate your choosing the [facility] for your healthcare needs, and hope your visit today was EXCELLENT.    Instructions:  Please follow-up with Dr. Delia Heady, primary physician, in 2-3 days.     Return to the Emergency Department for any worsening symptoms or concerns.    Below is some information that our patients often find helpful.    We wish you good health and please do not hesitate to contact us if we can ever be of any assistance.    Sincerely,  Harden Mo, MD  [facility]    ________________________________________________________________    If you do not continue to improve or your condition worsens, please contact your doctor or return immediately to the Emergency Department.    Thank you for choosing [facility] for your emergency care needs.  We strive to provide EXCELLENT care to you and your family.      DOCTOR REFERRALS  Call [phone] if you need any further referrals and we can help you find a primary care doctor or specialist.  Also, available online at:  [email or web address]    YOUR CONTACT INFORMATION  Before leaving please check with registration to make sure we have an up-to-date contact number.  You can call registration at [phone] to update your information.  For questions about your hospital bill, please call [phone].  For questions about your Emergency Dept Physician bill please call [phone].      FREE HEALTH SERVICES  If you need help with health or social services, please call 2-1-1 for a free referral to resources in your area.  2-1-1 is a free service connecting people with information on health insurance, free clinics, pregnancy, mental health, dental care, food assistance, housing, and substance abuse counseling.  Also, available online at:  http://[email or web address].org    MEDICAL RECORDS AND TESTS  Certain laboratory test results do not come back the same  day, for example urine cultures.   We will contact you if other important findings are noted.  Radiology films are often reviewed again to ensure accuracy.  If there is any discrepancy, we will notify you.      Please call [phone] to pick up a complimentary CD of any radiology studies performed.  If you or your doctor would like to request a copy of your medical records, please call [phone].      ORTHOPEDIC INJURY   Please know that significant injuries can exist even when an initial x-ray is read as normal or negative.  This can occur because some fractures (broken bones) are not initially visible on x-rays.  For this reason, close outpatient follow-up with your primary care doctor or bone specialist (orthopedist) is required.    MEDICATIONS AND FOLLOWUP  Please be aware that some prescription medications can cause drowsiness.  Use caution when driving or operating machinery.    The examination and treatment you have received in our Emergency Department is provided on an emergency basis, and is not intended to be a substitute for your primary care physician.  It is important that your doctor checks you again and that you report any new or remaining problems at that time.      24 HOUR PHARMACIES  [facility] [address] [place], [place] [address] (1.4 miles, 7 minutes)  [facility] - [address] [place], [place] [address] (6.5 miles, 13 minutes)  Handout with directions  available on request

## 2017-06-30 NOTE — ED Notes (Signed)
Bed: B23  Expected date: 06/30/17  Expected time:   Means of arrival:   Comments:  m 417

## 2017-06-30 NOTE — ED Notes (Signed)
Unable to receive medical hx at this time. Waiting for pt's daughter or family member to arrive. Pt was BIBA with soiled jeans and underwear. Pt denies any pain or discomfort at this time and is being monitored.

## 2017-06-30 NOTE — ED Triage Notes (Addendum)
BIBA w/ pt is a resident at [facility] and was found wandering 2 miles away, called in by passerby. Hx of dementia and Alhzeimers. Pt's family states he takes walks often on his own. The [facility] (?) believes he has been out since 1045 this morning. Pt denies any pain. Dexi 106 per EMS. Unable to receive any other medical hx at this time.

## 2017-06-30 NOTE — ED Provider Notes (Signed)
Physician/Midlevel provider first contact with patient: 06/30/17 1853         EMERGENCY DEPARTMENT HISTORY AND PHYSICAL EXAM    Date: 07/01/17  Patient Name: [facility]  Attending Physician: Danny East, MD  Patient DOB:  May 23, 1941  MRN:  [id number]  Room:  23/B23        History of Presenting Illness     Chief Complaint:    Chief Complaint   Patient presents with   . Altered Mental Status       Historian:  Patient     77 y.o. male, BIBA, with h/o Alzheimer's and Dementia presents to the ED with AMS after being found wandering outside tonight. The patient had gone on his typical daily walk at 10:45AM this morning, but did not return after 30 minutes like he usually does. Because the patient's wife has some mental impairment (oer daughter) as well, she did not call the patient's daughter until 1:15PM to let her know the patient was missing. The patient's daughter searched for the patient herself until 2:30PM when she called the police. Of note, the patient lives at [facility] with his wife.         PMD: Hazle Coca, MD      Past Medical History     Past Medical History:   Diagnosis Date   . Alzheimer disease    . Dementia    . Hypertension    . Prostate cancer        Past Surgical History     History reviewed. No pertinent surgical history.    Family History     History reviewed. No pertinent family history.    Social History     Social History     Social History   . Marital status: Married     Spouse name: N/A   . Number of children: N/A   . Years of education: N/A     Social History Main Topics   . Smoking status: Never Smoker   . Smokeless tobacco: Never Used   . Alcohol use No   . Drug use: No   . Sexual activity: Not on file     Other Topics Concern   . Not on file     Social History Narrative   . No narrative on file       Allergies     No Known Allergies    Home Medications     Home medications reviewed by ED MD     There are no discharge medications for this patient.        Review of  Systems     Constitutional:  No fever  Eyes: No discharge   ENT: No ST  CV:  No CP   Resp:  No SOB or cough  GI: No abd pain, N, V, D  GU: No dysuria  Skin: No rash  Neuro: +AMS, No HA  Psych:  No behavior changes  All other systems reviewed and negative    Physical Exam     BP 112/58   Pulse 64   Temp 97.7 F (36.5 C) (Oral)   Resp 18   SpO2 100%     CONSTITUTIONAL Patient is afebrile, Vital signs reviewed. Disoriented to place and year which is baseline as per family.  HEAD Atraumatic, Normocephalic.  EYES No discharge from eyes, Sclera are normal.  NECK   Normal ROM, Cervical spine nontender  RESPIRATORY CHEST Chest is nontender, Breath sounds normal, No  respiratory distress.  CARDIOVASCULAR RRR, Heart sounds normal.  ABDOMEN Abdomen is nontender, No peritoneal signs, No distension  BACK   There is no CVA tenderness, There is no tenderness to palpation  UPPER EXTREMITY Swelling to dorsum of L hand. Full ROM of all 5 fingers, No cyanosis  LOWER EXTREMITY No cyanosis, No edema  NEURO GCS is 15, No focal motor deficits, No focal sensory deficits.  SKIN Skin is warm, Skin is dry.  PSYCHIATRIC Normal affect, Disoriented to place and year which is baseline as per family.    Monitors, EKG     EKG (interpreted by ED physician): NSR @ 70BPM. Negative ST elevations.         Orders Placed During This Encounter     Orders Placed This Encounter   Procedures   . XR Chest  AP Portable   . CT Head WO Contrast   . Hand Left PA Lateral and Oblique   . Wrist Left PA Lateral and Oblique   . CBC with differential   . PT/APTT   . Comprehensive metabolic panel   . Troponin I   . Creatine Kinase (CK)   . UA with reflex to micro (pts  3 + yrs)   . GFR         ED Medications Administered     ED Medication Orders     None                Data Review     Nursing Records Reviewed and Agree: Yes  Laboratory results reviewed by ED provider: if applicable yes  Radiologic study results reviewed by ED provider:  If applicable yes        I,  Danny East, MD, personally performed the services documented. Gloriajean Dell is scribing for me on Danny Sutton,Danny Sutton. I reviewed and confirm the accuracy of the information in this medical record.    I, Gloriajean Dell, am serving as a scribe to document services personally performed by Danny East, MD, based on the provider's statements to me.     Credentials: Gloriajean Dell, scribe    Rendering Provider: Blanche East, MD      Diagnostic Study Results     Labs     Results     Procedure Component Value Units Date/Time    UA with reflex to micro (pts  3 + yrs) [[id number]]  (Abnormal) Collected:  06/30/17 2022    Specimen:  Urine Updated:  06/30/17 2030     Urine Type Clean Catch     Color, UA Yellow     Clarity, UA Clear     Specific Gravity UA 1.012     Urine pH 7.0     Leukocyte Esterase, UA Negative     Nitrite, UA Negative     Protein, UR Negative     Glucose, UA Negative     Ketones UA 20 (A)     Urobilinogen, UA Normal mg/dL      Bilirubin, UA Negative     Blood, UA Negative    CBC with differential [[id number]]  (Abnormal) Collected:  06/30/17 1921    Specimen:  Blood from Blood Updated:  06/30/17 2013     WBC 8.26 x10 3/uL      Hgb 13.9 g/dL      Hematocrit 03.4 (L) %      Platelets 263 x10 3/uL      RBC 4.37 (L) x10 6/uL      MCV 91.3 fL  MCH 31.8 pg      MCHC 34.8 g/dL      RDW 13 %      MPV 9.6 fL      Neutrophils 79.0 %      Lymphocytes Automated 11.6 %      Monocytes 8.4 %      Eosinophils Automated 0.1 %      Basophils Automated 0.4 %      Immature Granulocyte 0.5 %      Nucleated RBC 0.0 /100 WBC      Neutrophils Absolute 6.53 x10 3/uL      Abs Lymph Automated 0.96 x10 3/uL      Abs Mono Automated 0.69 x10 3/uL      Abs Eos Automated 0.01 x10 3/uL      Absolute Baso Automated 0.03 x10 3/uL      Absolute Immature Granulocyte 0.04 x10 3/uL      Absolute NRBC 0.00 x10 3/uL     Troponin I [[id number]] Collected:  06/30/17 1921    Specimen:  Blood Updated:  06/30/17 1953     Troponin I 0.01 ng/mL     GFR  [[id number]] Collected:  06/30/17 1922     Updated:  06/30/17 1946     EGFR 58.7    Comprehensive metabolic panel [[id number]] Collected:  06/30/17 1922    Specimen:  Blood Updated:  06/30/17 1946     Glucose 98 mg/dL      BUN 16 mg/dL      Creatinine 1.2 mg/dL      Sodium 086 mEq/L      Potassium 4.4 mEq/L      Chloride 104 mEq/L      CO2 23 mEq/L      Calcium 9.8 mg/dL      Protein, Total 6.4 g/dL      Albumin 4.2 g/dL      AST (SGOT) 29 U/L      ALT 20 U/L      Alkaline Phosphatase 102 U/L      Bilirubin, Total 1.0 mg/dL      Globulin 2.2 g/dL      Albumin/Globulin Ratio 1.9     Anion Gap 12.0    Creatine Kinase (CK) [[id number]]  (Abnormal) Collected:  06/30/17 1922    Specimen:  Blood Updated:  06/30/17 1946     Creatine Kinase (CK) 417 (H) U/L     PT/APTT [[id number]] Collected:  06/30/17 1922     Updated:  06/30/17 1937     PT 12.9 sec      PT INR 1.0     PT Anticoag. Given Within 48 hrs. None     PTT 28 sec           Radiologic Studies  Radiology Results (24 Hour)     Procedure Component Value Units Date/Time    Hand Left PA Lateral and Oblique [[id number]] Collected:  06/30/17 2112    Order Status:  Completed Updated:  06/30/17 2119    Narrative:       Exam:  XR HAND LEFT PA LATERAL AND OBLIQUE    History: Trauma    Comparison: None.    Findings: 3 views of the left hand    A metallic ring is noted in the left fourth finger, obscuring underlying  bone. No acute displaced fracture or dislocation in the remaining  fingers. Early joint space narrowing in the second metacarpal phalangeal  joint. Mild arthritis the basal joint of the thumb.  Impression:           1.  Metallic ring in the fourth finger obscures underlying bone.  2.  No acute displaced fracture or dislocation in the remaining bones.    Candi Leash, MD   06/30/2017 9:15 PM    Wrist Left PA Lateral and Oblique [[id number]] Collected:  06/30/17 2104    Order Status:  Completed Updated:  06/30/17 2109    Narrative:       Exam:  XR WRIST LEFT 3+  VIEWS    History: Trauma    Comparison: None.    Findings: 4 views of the left wrist    No acute displaced fracture, abnormal subluxation or dislocation. Mild  soft tissue fullness in the dorsal wrist. No significant joint space  narrowing.      Impression:           1.  Mild soft tissue fullness in the dorsal wrist. No acute displaced  fracture noted.     Candi Leash, MD   06/30/2017 9:05 PM    XR Chest  AP Portable [[id number]] Collected:  06/30/17 2100    Order Status:  Completed Updated:  06/30/17 2105    Narrative:       HISTORY: Chest pain.    COMPARISON: None    FINDINGS:  AP upright radiograph of the chest demonstrates clear lungs  bilaterally. The heart size is normal. The aorta and pulmonary  vasculature is normal. No pneumothorax or edema. Scoliosis in the  thoracolumbar spine.      Impression:           1.  No focal infiltrate or consolidation.    Candi Leash, MD   06/30/2017 9:01 PM    CT Head WO Contrast [[id number]] Collected:  06/30/17 2048    Order Status:  Completed Updated:  06/30/17 2055    Narrative:       HISTORY: Decreased alertness.    COMPARISON: Brain MRI 08/28/2016.    TECHNIQUE: Axial noncontrast imaging through the head was performed.    This CT study was performed using radiation dose reduction techniques  including one or more of the following: automated exposure control,  adjustment of the mA and/or kV according to patient size, and the use of  iterative reconstruction technique.    FINDINGS: Diffuse sulcal prominence, ventricular dilation and chronic  ischemic changes appear stable. No acute infarct or intracranial  hemorrhage is seen. The paranasal sinuses and mastoid air cells appear  clear. Internal carotid and vertebral artery calcifications are  visualized.      Impression:        No acute intracranial abnormality is seen.             Leandro Reasoner, MD   06/30/2017 8:51 PM      .        Procedures         MDM and Clinical Notes     MDM:    History, physical, labs, and imaging were done to  evaluate the patient. Everything appears to be negative acute. Patient's wife and daughter are here to take the patient home. They feel comfortable taking care of the patient at home. I provided the case management's number as a resource. The family will follow-up with their PCP as well.      Consults D/W:    Reevaluation:     9:26 PM Re-eval:   Feeling much better, VS stable,  no acute distress, looks  well.     Counseled re dx  Counseled re follow up  Answered all questions  Counseled red flags and signs and sxs to return for.  Comfortable with follow up and discharge plan      Return Precautions    The patient is aware that this evaluation is only a screening for emergent conditions related to his or her symptoms and presentation.   I discussed the need for prompt follow-up.  Patient demonstrates verbal understanding.  Patient advised to return to the ED for any worsening symptoms, uncontrolled pain if applicable, worsening fevers, or any changes in their condition prompting concern and need for repeat evaluation and/or additional management.        Diagnosis and Disposition   Diagnosis/Clinical Impression:  1. Altered mental status, unspecified altered mental status type        Disposition  ED Disposition     ED Disposition Condition Date/Time Comment    Discharge  Sat Jun 30, 2017  9:26 PM Teressa Lower discharge to home/self care.    Condition at disposition: Stable          Prescriptions    There are no discharge medications for this patient.        Critical Care     Critical care exclusive of time spent performing procedures.    Total time:            Signout If Applicable     Patient signed out to:      Signout notes:                 Harden Mo, MD  07/01/17 [phone]

## 2017-07-01 LAB — ECG 12-LEAD
Atrial Rate: 70 {beats}/min
P Axis: 77 degrees
P-R Interval: 190 ms
Q-T Interval: 406 ms
QRS Duration: 106 ms
QTC Calculation (Bezet): 438 ms
R Axis: 70 degrees
T Axis: 71 degrees
Ventricular Rate: 70 {beats}/min

## 2018-06-10 ENCOUNTER — Emergency Department: Payer: Medicare Other

## 2018-06-10 ENCOUNTER — Emergency Department
Admission: EM | Admit: 2018-06-10 | Discharge: 2018-06-10 | Disposition: A | Discharge status: Home / Self Care | Payer: Medicare Other | Attending: Emergency Medical Services | Admitting: Emergency Medical Services

## 2018-06-10 DIAGNOSIS — S0990XA Unspecified injury of head, initial encounter: Secondary | ICD-10-CM | POA: Insufficient documentation

## 2018-06-10 DIAGNOSIS — I10 Essential (primary) hypertension: Secondary | ICD-10-CM | POA: Insufficient documentation

## 2018-06-10 DIAGNOSIS — G309 Alzheimer's disease, unspecified: Secondary | ICD-10-CM | POA: Insufficient documentation

## 2018-06-10 DIAGNOSIS — F0280 Dementia in other diseases classified elsewhere without behavioral disturbance: Secondary | ICD-10-CM | POA: Insufficient documentation

## 2018-06-10 DIAGNOSIS — Y92512 Supermarket, store or market as the place of occurrence of the external cause: Secondary | ICD-10-CM | POA: Insufficient documentation

## 2018-06-10 DIAGNOSIS — W19XXXA Unspecified fall, initial encounter: Secondary | ICD-10-CM

## 2018-06-10 DIAGNOSIS — R55 Syncope and collapse: Secondary | ICD-10-CM | POA: Insufficient documentation

## 2018-06-10 DIAGNOSIS — W1839XA Other fall on same level, initial encounter: Secondary | ICD-10-CM | POA: Insufficient documentation

## 2018-06-10 DIAGNOSIS — Y9301 Activity, walking, marching and hiking: Secondary | ICD-10-CM | POA: Insufficient documentation

## 2018-06-10 LAB — GFR: EGFR: >60

## 2018-06-10 LAB — CBC AND DIFFERENTIAL
Absolute NRBC: 0 10*3/uL (ref 0.00–0.00)
Basophils Absolute Automated: 0.03 10*3/uL (ref 0.00–0.08)
Basophils Automated: 0.5 %
Eosinophils Absolute Automated: 0.01 10*3/uL (ref 0.00–0.44)
Eosinophils Automated: 0.2 %
Hematocrit: 41 % (ref 37.6–49.6)
Hgb: 14.3 g/dL (ref 12.5–17.1)
Immature Granulocytes Absolute: 0.04 10*3/uL (ref 0.00–0.07)
Immature Granulocytes: 0.6 %
Lymphocytes Absolute Automated: 1.31 10*3/uL (ref 0.42–3.22)
Lymphocytes Automated: 20.2 %
MCH: 31.9 pg (ref 25.1–33.5)
MCHC: 34.9 g/dL (ref 31.5–35.8)
MCV: 91.5 fL (ref 78.0–96.0)
MPV: 9.6 fL (ref 8.9–12.5)
Monocytes Absolute Automated: 0.61 10*3/uL (ref 0.21–0.85)
Monocytes: 9.4 %
Neutrophils Absolute: 4.48 10*3/uL (ref 1.10–6.33)
Neutrophils: 69.1 %
Nucleated RBC: 0 /100 WBC (ref 0.0–0.0)
Platelets: 288 10*3/uL (ref 142–346)
RBC: 4.48 10*6/uL (ref 4.20–5.90)
RDW: 13 % (ref 11–15)
WBC: 6.48 10*3/uL (ref 3.10–9.50)

## 2018-06-10 LAB — COMPREHENSIVE METABOLIC PANEL
ALT: 25 U/L (ref 0–55)
AST (SGOT): 25 U/L (ref 5–34)
Albumin/Globulin Ratio: 1.7 (ref 0.9–2.2)
Albumin: 4 g/dL (ref 3.5–5.0)
Alkaline Phosphatase: 105 U/L (ref 38–106)
Anion Gap: 9 (ref 5.0–15.0)
BUN: 15 mg/dL (ref 9–28)
Bilirubin, Total: 0.9 mg/dL (ref 0.2–1.2)
CO2: 24 mEq/L (ref 22–29)
Calcium: 9.5 mg/dL (ref 7.9–10.2)
Chloride: 103 mEq/L (ref 100–111)
Creatinine: 1.1 mg/dL (ref 0.7–1.3)
Globulin: 2.4 g/dL (ref 2.0–3.6)
Glucose: 126 mg/dL — ABNORMAL HIGH (ref 70–100)
Potassium: 3.9 mEq/L (ref 3.5–5.1)
Protein, Total: 6.4 g/dL (ref 6.0–8.3)
Sodium: 136 mEq/L (ref 136–145)

## 2018-06-10 LAB — GLUCOSE WHOLE BLOOD - POCT: Whole Blood Glucose POCT: 113 mg/dL — ABNORMAL HIGH (ref 70–100)

## 2018-06-10 LAB — TROPONIN I: Troponin I: 0.01 ng/mL (ref 0.00–0.05)

## 2018-06-10 MED ORDER — SODIUM CHLORIDE 0.9 % IV BOLUS
500.00 mL | Freq: Once | INTRAVENOUS | Status: AC
Start: 2018-06-10 — End: 2018-06-10
  Administered 2018-06-10: 15:00:00 500 mL via INTRAVENOUS

## 2018-06-10 NOTE — ED Notes (Signed)
Bed: B16  Expected date:   Expected time:   Means of arrival:   Comments:  [phone]

## 2018-06-10 NOTE — Discharge Instructions (Signed)
Dear [name]:    I appreciate your choosing the [facility] for your healthcare needs, and hope your visit today was EXCELLENT.    Instructions:  Please follow-up with Dr. Delia Heady (your primary care physician) and your neurologist as scheduled.     Return to the Emergency Department for any worsening symptoms or concerns.    Below is some information that our patients often find helpful.    We wish you good health and please do not hesitate to contact us if we can ever be of any assistance.    Sincerely,  Pamala Hurry, MD  [facility]    ________________________________________________________________    If you do not continue to improve or your condition worsens, please contact your doctor or return immediately to the Emergency Department.    Thank you for choosing [facility] for your emergency care needs.  We strive to provide EXCELLENT care to you and your family.      DOCTOR REFERRALS  Call [phone] if you need any further referrals and we can help you find a primary care doctor or specialist.  Also, available online at:  [email or web address]    YOUR CONTACT INFORMATION  Before leaving please check with registration to make sure we have an up-to-date contact number.  You can call registration at [phone] to update your information.  For questions about your hospital bill, please call [phone].  For questions about your Emergency Dept Physician bill please call [phone].      FREE HEALTH SERVICES  If you need help with health or social services, please call 2-1-1 for a free referral to resources in your area.  2-1-1 is a free service connecting people with information on health insurance, free clinics, pregnancy, mental health, dental care, food assistance, housing, and substance abuse counseling.  Also, available online at:  http://[email or web address].org    MEDICAL RECORDS AND TESTS  Certain laboratory  test results do not come back the same day, for example urine cultures.   We will contact you if other important findings are noted.  Radiology films are often reviewed again to ensure accuracy.  If there is any discrepancy, we will notify you.      Please call [phone] to pick up a complimentary CD of any radiology studies performed.  If you or your doctor would like to request a copy of your medical records, please call [phone].      ORTHOPEDIC INJURY   Please know that significant injuries can exist even when an initial x-ray is read as normal or negative.  This can occur because some fractures (broken bones) are not initially visible on x-rays.  For this reason, close outpatient follow-up with your primary care doctor or bone specialist (orthopedist) is required.    MEDICATIONS AND FOLLOWUP  Please be aware that some prescription medications can cause drowsiness.  Use caution when driving or operating machinery.    The examination and treatment you have received in our Emergency Department is provided on an emergency basis, and is not intended to be a substitute for your primary care physician.  It is important that your doctor checks you again and that you report any new or remaining problems at that time.      24 HOUR PHARMACIES  [facility] - [address] [place], [place] [address] (1.4 miles, 7 minutes)  [facility] - [address], [place], [place] [address] (6.5 miles, 13 minutes)  Handout with directions available on request    PATIENT RELATIONS  If you have any concerns, issues, or feedback related to your care, positive or negative, please do not hesitate to contact Patient Relations at [phone]. They are open from 8:30AM-5:00PM Monday through Friday.

## 2018-06-10 NOTE — ED Triage Notes (Signed)
Pt lost balance in grocery store while shopping with wife and care taker falling back and hitting back of head.  No LOC,, has small laceration to posterior head.  PT history of dementia, acting normal per family.

## 2018-06-10 NOTE — ED Provider Notes (Signed)
Physician/Midlevel provider first contact with patient: 06/10/18 1437         [facility] EMERGENCY DEPARTMENT HISTORY AND PHYSICAL EXAM    Patient Name: Danny Sutton, Danny Sutton  Encounter Date:  06/10/2018  Rendering Provider: Pete Glatter, MD  Patient DOB:  14-May-1941  MRN:  [id number]    History of Presenting Illness     Historian: Patient, Patient's Wife, Patient's Daughter, and Patient's Caregiver    77 y.o. male with h/o syncopal episodes (per pt's daughter), HTN, prostate cancer, Alzheimer's disease, and dementia p/w acute onset of occipital scalp pain that started s/p witnessed fall PTA. Associated with abrasion to occiput and questionable brief change in level of consciousness. Pt's wife states that she was walking with the pt in the grocery store when he fell backwards and hit the back of his head on a shelf of wine. Pt's wife reports questionable brief change in level of consciousness s/p fall. Pt's caregiver notes that the pt then sat down and said he was ok after falling.      Per pt's daughter, pt has had occasional syncopal episodes over the past couple of months. Per pt's daughter, pt was seen by his PCP for his syncopal episodes and told to stop his Lisinopril given low BP reading in the office -- BP improved from ~90/50 to ~120/60 after stopping medication.     PMD:  Hazle Coca, MD    Past Medical History     Past Medical History:   Diagnosis Date   . Alzheimer disease    . Dementia    . Hypertension    . Prostate cancer        Past Surgical History     History reviewed. No pertinent surgical history.    Family History     History reviewed. No pertinent family history.    Social History     Social History     Socioeconomic History   . Marital status: Married     Spouse name: Not on file   . Number of children: Not on file   . Years of education: Not on file   . Highest education level: Not on file   Occupational History   . Not on file   Social Needs   . Financial resource strain: Not on file   .  Food insecurity:     Worry: Not on file     Inability: Not on file   . Transportation needs:     Medical: Not on file     Non-medical: Not on file   Tobacco Use   . Smoking status: Never Smoker   . Smokeless tobacco: Never Used   Substance and Sexual Activity   . Alcohol use: No   . Drug use: No   . Sexual activity: Not on file   Lifestyle   . Physical activity:     Days per week: Not on file     Minutes per session: Not on file   . Stress: Not on file   Relationships   . Social connections:     Talks on phone: Not on file     Gets together: Not on file     Attends religious service: Not on file     Active member of club or organization: Not on file     Attends meetings of clubs or organizations: Not on file     Relationship status: Not on file   . Intimate partner violence:  Fear of current or ex partner: Not on file     Emotionally abused: Not on file     Physically abused: Not on file     Forced sexual activity: Not on file   Other Topics Concern   . Not on file   Social History Narrative   . Not on file       Home Medications     Home medications reviewed by ED MD     Previous Medications    GALANTAMINE (RAZADYNE ER) 16 MG 24 HR CAPSULE    Take 16 mg by mouth daily With food    SERTRALINE (ZOLOFT) 50 MG TABLET    Take 100 mg by mouth daily    VITAMIN B-12 (CYANOCOBALAMIN) 1000 MCG/ML INJECTION    Inject 1 mL into the muscle every 2 (two) months       Review of Systems     Constitutional:  No fever  Eyes: No discharge   HENT: +Occipital scalp pain, abrasion to occiput. No ST  CV:  No CP   Resp:  No SOB or cough  GI: No abd pain, N, V, D  GU: No dysuria  Skin: No rash  Neuro: +Questionable brief change in level of consciousness. No HA  Psych:  No behavior changes  All other systems reviewed and negative    Physical Exam     BP 130/67   Pulse 67   Temp 97.8 F (36.6 C) (Oral)   Resp 18   Wt 75.2 kg   SpO2 98%     CONSTITUTIONAL   Patient is afebrile, Vital Signs Reviewed, Well appearing, Patient appears  comfortable, Alert.  HEAD   1 cm abrasion to back of head. Normocephalic.  EYES   Eyes are normal to inspection, No discharge from eyes, Sclera are normal.  ENT   Mouth normal to inspection. Moist oral mucosa.  NECK   Neck is non-TTP. Normal ROM, No jugular venous distention, Normal inspection.  RESPIRATORY CHEST    Breath sounds normal, No respiratory distress.   CARDIOVASCULAR   RRR, Heart sounds normal, Normal S1 S2.  ABDOMEN   Abdomen is nontender. No distension,  No peritoneal signs.  UPPER EXTREMITY   Inspection normal, No cyanosis, No clubbing, No edema.  NEURO   GCS is 15, No focal motor deficits. Speech is normal, appropriate.  SKIN   Skin is warm, Skin is dry, Skin is normal color.  PSYCHIATRIC   Oriented X 3, Normal affect.    ED Medications Administered     ED Medication Orders (From admission, onward)    Start Ordered     Status Ordering Provider    06/10/18 1506 06/10/18 1505  sodium chloride 0.9 % bolus 500 mL  Once     Route: Intravenous  Ordered Dose: 500 mL     Last MAR action:  New Bag [place] Eppard S          Orders Placed During This Encounter     Orders Placed This Encounter   Procedures   . XR Chest  AP Portable   . CT Head WO Contrast   . CT Cervical Spine without Contrast   . CBC with differential   . Comprehensive metabolic panel   . Troponin I   . GFR   . Glucose POC   . Orthostatic Vital Signs   . Glucose Whole Blood - POCT   . ECG 12 Lead       Diagnostic Study Results  The results of the diagnostic studies below were reviewed by the ED provider:    Labs  Results     Procedure Component Value Units Date/Time    Troponin I [[id number]] Collected:  06/10/18 1519    Specimen:  Blood Updated:  06/10/18 1546     Troponin I 0.01 ng/mL     GFR [[id number]] Collected:  06/10/18 1519     Updated:  06/10/18 1543     EGFR >60.0    Comprehensive metabolic panel [[id number]]  (Abnormal) Collected:  06/10/18 1519    Specimen:  Blood Updated:  06/10/18 1543     Glucose 126 mg/dL      BUN 15 mg/dL       Creatinine 1.1 mg/dL      Sodium 086 mEq/L      Potassium 3.9 mEq/L      Chloride 103 mEq/L      CO2 24 mEq/L      Calcium 9.5 mg/dL      Protein, Total 6.4 g/dL      Albumin 4.0 g/dL      AST (SGOT) 25 U/L      ALT 25 U/L      Alkaline Phosphatase 105 U/L      Bilirubin, Total 0.9 mg/dL      Globulin 2.4 g/dL      Albumin/Globulin Ratio 1.7     Anion Gap 9.0    CBC with differential [[id number]] Collected:  06/10/18 1519    Specimen:  Blood Updated:  06/10/18 1526     WBC 6.48 x10 3/uL      Hgb 14.3 g/dL      Hematocrit 52.8 %      Platelets 288 x10 3/uL      RBC 4.48 x10 6/uL      MCV 91.5 fL      MCH 31.9 pg      MCHC 34.9 g/dL      RDW 13 %      MPV 9.6 fL      Neutrophils 69.1 %      Lymphocytes Automated 20.2 %      Monocytes 9.4 %      Eosinophils Automated 0.2 %      Basophils Automated 0.5 %      Immature Granulocyte 0.6 %      Nucleated RBC 0.0 /100 WBC      Neutrophils Absolute 4.48 x10 3/uL      Abs Lymph Automated 1.31 x10 3/uL      Abs Mono Automated 0.61 x10 3/uL      Abs Eos Automated 0.01 x10 3/uL      Absolute Baso Automated 0.03 x10 3/uL      Absolute Immature Granulocyte 0.04 x10 3/uL      Absolute NRBC 0.00 x10 3/uL     Glucose Whole Blood - POCT [[id number]]  (Abnormal) Collected:  06/10/18 1423     Updated:  06/10/18 1424     POCT - Glucose Whole blood 113 mg/dL           Radiologic Studies  Radiology Results (24 Hour)     Procedure Component Value Units Date/Time    CT Cervical Spine without Contrast [[id number]] Collected:  06/10/18 1552    Order Status:  Completed Updated:  06/10/18 1556    Narrative:       CLINICAL HISTORY:  Trauma with pain    FINDINGS: CT of the cervical spine was performed using axial images with  reformatted  coronal and sagittal images.    Alignment is normal. The vertebral bodies are normal in stature. The  articular columns are properly aligned. There are no fractures.  Degenerative changes are present. The surrounding soft tissues are  grossly normal.           Impression:        No fracture of the cervical spine      This CT study was performed using radiation dose reduction techniques  including one or more of the following: automated exposure control,  adjustment of the mA and/or kV according to patient's size and the use  of an iterative reconstruction technique.    Trilby Drummer, MD   06/10/2018 3:52 PM    CT Head WO Contrast [[id number]] Collected:  06/10/18 1547    Order Status:  Completed Updated:  06/10/18 1553    Narrative:         CLINICAL HISTORY: Fall      COMPARISON: CT on 06/30/2017    FINDINGS:  CT examination of the head was performed using standard  technique.      There are no intracranial hemorrhages or abnormal fluid collections.  There are no masses. There is no mass effect or midline shift.  Ventricles and sulci are normal for age. The gray-white matter junctions  are normal. There is no evidence of an acute infarct. The included  portions of the intraorbital structures are normal.      Images at bone window settings demonstrate no fractures or other  abnormalities.              Impression:        No acute process, no change.            This CT study was performed using radiation dose reduction techniques  including one or more of the following: automated exposure control,  adjustment of the mA and/or kV according to patient's size and the use  of an iterative reconstruction technique.    Trilby Drummer, MD   06/10/2018 3:49 PM    XR Chest  AP Portable [[id number]] Collected:  06/10/18 1516    Order Status:  Completed Updated:  06/10/18 1521    Narrative:       CLINICAL HISTORY:   Syncope     COMPARISON: Chest x-ray on 06/30/2017    FINDINGS: Heart size is normal. Minor aortic atherosclerotic tortuosity  is present.    The lungs remain clear. There are no masses, nodules, areas of  consolidation, pleural effusions, pneumothoraces, or other  abnormalities.    The bones are unremarkable.            Impression:        Stable examination. The lungs remain  clear. Normal size  heart.    Trilby Drummer, MD   06/10/2018 3:17 PM          Scribe and MD Attestations     I, Pete Glatter, MD, personally performed the services documented. Barbra Sarks is scribing for me on [place]. I reviewed and confirm the accuracy of the information in this medical record.    I, Barbra Sarks, am serving as a scribe to document services personally performed by Pete Glatter, MD, based on the provider's statements to me.     Credentials: Barbra Sarks, scribe    Rendering Provider: Pete Glatter, MD    Monitors, EKG, Critical Care, and Splints     (1427) EKG (interpreted by ED physician): Sinus bradycardia at  rate of 58 bpm. Incomplete RBBB. O/w normal EKG. No significant change compared to 06/30/2016.    Critical Care: na  Splint check: na     MDM and Clinical Notes     MDM: 77 y.o. male presents with his wife and daughter after a fall while walking at the grocery store. It is unclear if he had some altered consciousness based on his wife, who witnessed the episode.     Pt has a h/o dementia for a couple of years and is going through extensive evaluation for that.     His daughter notes that he has had occasional episodes of syncope for the past couple of months. On evaluation by his PMD, his BP was found to be low in the office and his Lisinopril was stopped --  this is the first episode of syncope since.     ED evaluation is reassuring. I presented the option of an observation admission, but pt and daughter prefer to have him go home and continue with outpatient workup. He has multiple doctors and is scheduled to have a brain MRI this weekend.     Diagnosis and Disposition     Clinical Impression  1. Near syncope    2. Fall, initial encounter    3. Closed head injury, initial encounter        Disposition  ED Disposition     ED Disposition Condition Date/Time Comment    Discharge  Mon Jun 10, 2018  4:34 PM Teressa Lower discharge to home/self care.    Condition at disposition:  Stable          Prescriptions       New Prescriptions    No medications on file                Pamala Hurry, MD  06/13/18 [phone]

## 2018-06-10 NOTE — Progress Notes (Signed)
Medication Reconciliation Interventions  (eg: added home meds/OTC/herbals not listed, discontinued meds pt no longer taking, corrected dose/frequency, compliance, cost, dose timing, etc)    ** Reviewed with patient's daughter  ** Received Vitamin b-12 injection 6 weeks ago      Prior to Admission medications    Medication Sig Start Date End Date Taking? Authorizing Provider   galantamine (RAZADYNE ER) 16 MG 24 hr capsule Take 16 mg by mouth daily With food 04/17/18  Yes [provider]   sertraline (ZOLOFT) 50 MG tablet Take 100 mg by mouth daily 03/11/18  Yes [provider]   vitamin B-12 (CYANOCOBALAMIN) 1000 MCG/ML injection Inject 1 mL into the muscle every 2 (two) months 02/23/17   [provider]       Admission Medication History - BASIC    Patient's Pharmacy Information:    Primary Pharmacy Updated in Epic: No    Preferred pharmacy: No Pharmacies Listed    Primary Source of Medication History: Family Member      Summary:    There are no significant changes to the medication list.    Daughter states that there is Good compliance with medications because of caregiver.  Daughter demonstrates Good knowledge of medications.     Ancil Boozer Ages, [facility]

## 2018-06-11 ENCOUNTER — Other Ambulatory Visit: Payer: Self-pay | Admitting: Neurology

## 2018-06-11 ENCOUNTER — Ambulatory Visit
Admission: RE | Admit: 2018-06-11 | Discharge: 2018-06-11 | Disposition: A | Discharge status: Home / Self Care | Payer: Medicare Other | Source: Ambulatory Visit | Attending: Neurology | Admitting: Neurology

## 2018-06-11 DIAGNOSIS — G912 (Idiopathic) normal pressure hydrocephalus: Secondary | ICD-10-CM | POA: Insufficient documentation

## 2018-06-11 DIAGNOSIS — G9389 Other specified disorders of brain: Secondary | ICD-10-CM | POA: Insufficient documentation

## 2018-06-11 DIAGNOSIS — I6782 Cerebral ischemia: Secondary | ICD-10-CM | POA: Insufficient documentation

## 2018-06-11 LAB — ECG 12-LEAD
Atrial Rate: 58 {beats}/min
P Axis: 79 degrees
P-R Interval: 192 ms
Q-T Interval: 408 ms
QRS Duration: 104 ms
QTC Calculation (Bezet): 400 ms
R Axis: 67 degrees
T Axis: 68 degrees
Ventricular Rate: 58 {beats}/min

## 2019-04-25 ENCOUNTER — Other Ambulatory Visit: Payer: Self-pay | Admitting: Neurology

## 2019-04-25 ENCOUNTER — Encounter (FREE_STANDING_LABORATORY_FACILITY): Payer: Medicare Other

## 2019-04-25 ENCOUNTER — Other Ambulatory Visit: Payer: Self-pay | Admitting: Nurse Practitioner

## 2019-04-25 DIAGNOSIS — G912 (Idiopathic) normal pressure hydrocephalus: Secondary | ICD-10-CM

## 2019-04-25 LAB — CELL COUNT CSF TUBE #1
CSF RBC Count Tube #1: 1 /mm3 (ref 0–3)
CSF WBC Count Tube #1: 1 /mm3 (ref 0–5)

## 2019-04-25 LAB — PROTEIN, CSF: CSF Protein: 45.5 mg/dL — ABNORMAL HIGH (ref 15.0–40.0)

## 2019-04-25 LAB — CELL COUNT CSF TUBE #3
CSF RBC Count Tube #3: 1 /mm3 (ref 0–3)
CSF WBC Count Tube #3: 1 /mm3 (ref 0–5)

## 2019-04-25 LAB — GLUCOSE CSF: CSF Glucose: 64 mg/dL (ref 40–70)

## 2019-05-14 ENCOUNTER — Encounter (INDEPENDENT_AMBULATORY_CARE_PROVIDER_SITE_OTHER): Payer: Self-pay

## 2019-05-14 ENCOUNTER — Telehealth (INDEPENDENT_AMBULATORY_CARE_PROVIDER_SITE_OTHER): Payer: Self-pay | Admitting: Nurse Practitioner

## 2019-05-14 NOTE — Telephone Encounter (Signed)
I called and left vmail for daughter-Susan on behalf of patient. RE: referral from Dr. Roque Cash to Dr. Sherril Croon. Dx/reason- NPH,LP drain trial. Attempt to schedule eval w/ [facility] on either 05/21/19, 9am or 05/28/19, 9am. leetama

## 2019-05-15 ENCOUNTER — Telehealth (INDEPENDENT_AMBULATORY_CARE_PROVIDER_SITE_OTHER): Payer: Self-pay | Admitting: Neurological Surgery

## 2019-05-15 NOTE — Telephone Encounter (Signed)
Spoke to patients daughter. She was not aware how it came to [place] having to schedule her an appointment. I explained to her that our Doctor received a message from Dr. Roque Cash to get her father scheduled with [place]. Daughter stated that she doesn't want to schedule anything yet. That her father has an appointment with their Neurologist. Wants to have that appointment first with Neurologist to discuss information first before doing any scheduling. Gave daughter schedulers direct telephone#.

## 2019-05-16 ENCOUNTER — Encounter (INDEPENDENT_AMBULATORY_CARE_PROVIDER_SITE_OTHER): Payer: Self-pay

## 2019-05-16 ENCOUNTER — Encounter (HOSPITAL_BASED_OUTPATIENT_CLINIC_OR_DEPARTMENT_OTHER): Payer: Self-pay

## 2019-05-16 ENCOUNTER — Telehealth (INDEPENDENT_AMBULATORY_CARE_PROVIDER_SITE_OTHER): Payer: Medicare Other | Admitting: Neurology

## 2019-05-16 ENCOUNTER — Encounter (INDEPENDENT_AMBULATORY_CARE_PROVIDER_SITE_OTHER): Payer: Self-pay | Admitting: Neurology

## 2019-05-16 VITALS — Ht 69.0 in | Wt 160.0 lb

## 2019-05-16 DIAGNOSIS — N3942 Incontinence without sensory awareness: Secondary | ICD-10-CM

## 2019-05-16 DIAGNOSIS — R159 Full incontinence of feces: Secondary | ICD-10-CM

## 2019-05-16 DIAGNOSIS — G20 Parkinson's disease: Secondary | ICD-10-CM

## 2019-05-16 DIAGNOSIS — F0390 Unspecified dementia without behavioral disturbance: Secondary | ICD-10-CM

## 2019-05-16 MED ORDER — CARBIDOPA-LEVODOPA 25-100 MG PO TABS
1.5000 | ORAL_TABLET | Freq: Three times a day (TID) | ORAL | 3 refills | Status: DC
Start: 2019-05-16 — End: 2021-04-22

## 2019-05-16 NOTE — Progress Notes (Signed)
[email or web address]    HISTORY OF PRESENT ILLNESS:     Danny Sutton is a 78 y.o. 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  History is obtained from the patient's daughter.    Verbal consent has been obtained from the patient to conduct a video visit encounter to minimize exposure to COVID-19: yes      INITIAL HISTORY (04/2019):  He has had a "pretty severe" and at times rapid cognitive decline for about the last 5 years. His daughter reports that he has always been a "contemplative person" and would think more than speak, but this has been more and more of an issue recently. There has been some question of hallucinations with him seeming to look over people's shoulders when talking to them but because of the speech issue he cannot really report or describe this.  They deny any significant fluctuations in his cognition.    He walks very slowly and on occasion does not seem to pick up his feet. He has been doing some physical therapy since being at [facility] to work on his walking. He had a number of falls in the past that seemed to be related to orthostasis; this has not recurred since stopping his antihypertensives.    He has occasional tremors but these are not a major feature. He needs help getting dressed and can't do things like buttons or zippers himself. He is generally able to feed himself but needs some cueing. There is not a clear difference from side to side. He has been doing some occupational therapy recently as well.    He has had incontinence for a few years, worsening over time to involve both urinary and fecal incontinence.     He has been following with Dr Alysia Penna, who recommended he be tested for NPH. He had a lumbar puncture on October 30, and after this his children and staff at his facility thought that there were some subtle improvements in motor function with an easier time getting off  the table than on, slightly better gait, and more spontaneous movement like waving goodbye. They thought his cognition was likewise slightly better. His daughter admits this was not a dramatic improvement but was enough to clearly notice.    He took galantamine and did not have any benefit from this. He takes carbidopa-levodopa 25-100 mg at 1 tablet TID but has not had any clear response to this. He has not tried a higher dose.  He has not tried any other medications.      PAST HISTORY:     Past Medical History:   Diagnosis Date    Alzheimer disease     Dementia     Hypertension     Prostate cancer     Prostate cancer        History reviewed. No pertinent surgical history.      Social History     Tobacco Use    Smoking status: Never Smoker    Smokeless tobacco: Never Used   Substance Use Topics    Alcohol use: No    Drug use: No       Family History   Problem Relation Age of Onset    No known problems Mother        Current Outpatient Medications:     carbidopa-levodopa (SINEMET) 25-100 MG per tablet, 3 (three) times daily, Disp: , Rfl:     sertraline (ZOLOFT) 50 MG tablet, Take  100 mg by mouth daily, Disp: , Rfl: 3    galantamine (RAZADYNE ER) 16 MG 24 hr capsule, Take 16 mg by mouth daily With food, Disp: , Rfl: 3      No Known Allergies      REVIEW OF SYSTEMS:     A 10-point review of systems was completed and is negative except as documented in the HPI and as listed below:    None.      PHYSICAL EXAM:     Ht 1.753 m (5\' 9" ) Comment: pt's daughter report ht and wt   Wt 72.6 kg (160 lb)    BMI 23.63 kg/m     General:  Well-appearing, well nourished, pleasant patient in no acute distress. Mood and fund of knowledge are appropriate.    Head:  Normocephalic, atraumatic. Oropharynx and conjunctiva are clear.    Voice, Speech and Language:  Unable to assess language is no verbal output throughout visit.  Seems to understand verbal commands although does better when mimicked.    Neck:  Supple with full  range of motion.    Cardiovascular:  Not performed due to limitations of telemedicine encounter.    Extremities:  Full active range of motion.    Skin:  No rashes or lesions seen on visualized skin.    Cognitive and Mental Exam:  The patient is generally alert, though nods off at times.  Seems to understand verbal commands although does better when mimicked.  Unable to assess further due to clinical status.    Cranial Nerves:  Limited cranial nerve evaluation performed due to limitations of telemedicine encounter.    Pupils were equal, round, symmetric.  Extraocular movements were full.  Face is symmetric with normal strength.  Hearing was intact to voice.  Neck muscles are strong.  Tongue protrusion is at midline.  No dysarthria.    Motor:  Strength not formally assessed due to limitations of telemedicine encounter. Spontaneous movements are symmetric and at least anti-gravity throughout. No upper extremity drift.    Tremor: Brief tremor of both hands seen characterized by wrist flexion/extension.  Not present for most of the visit.  Tone: Not performed due to limitations of telemedicine encounter.  Bradykinesia: Moderatesevere generalized akinesia.  Difficulty following commands for formal assessment of hand bradykinesia.  Dystonia: None.  Dyskinesia: None.  Myoclonus: None.  Chorea: None.  Tics: None.    Sensory:  Not performed due to limitations of telemedicine encounter.    Coordination:  Finger-to-nose: Slow but otherwise normal.    Reflexes:  Not performed due to limitations of telemedicine encounter.    Gait:  Rises from chair with assistance.  Gait shows upright posture but slow, short stepping with assistance.  No clear magnetic quality but difficult to fully assess by video.  Multiple step turns.      RESULTS/IMAGING:     MRI Brain without (06/11/18):  IMPRESSION:   1.  There is mild ventricular prominence in comparison to sulcal prominence, a finding which has slightly progressed in the interim between  examinations. For reference, the third ventricle measures approximately 9 mm in greatest diameter, previously measuring up to 8 mm. Findings may represent sequelae of cerebral volume loss, a communicating hydrocephalus could have a similar appearance. Clinical correlation is recommended.  2. 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      ASSESSMENT:     Danny Sutton is a 78 y.o. man who presents for evaluation of cognitive changes, parkinsonism and incontinence.  He has  had progressive cognitive changes for approximately the last 5 years, as well as gait and mobility changes and progressive incontinence.  All of these have slowly worsened over time, and his daughter (who provides most of the history) notes that his symptoms have progressed faster than his wife who was diagnosed with Alzheimer's disease many years previous.    He has followed with Dr. Bennie Hind and was treated with galantamine without any clear benefit.  He has also been taking carbidopalevodopa 25-100 mg at 1 tablet 3 times daily without much benefit.  Most recently he had a lumbar puncture, after which there was apparently a mild but clear improvement in both his mobility and cognition that returned to baseline within a few days.  Because of this, as well as findings of mild but progressive ventriculomegaly on serial MRI studies of his brain, he was referred to [facility] neurology/neurosurgery for further assessment of the possibility of normal pressure hydrocephalus.  Neurologic exam today is limited as he has some difficulty following commands and does not speak throughout her visit.  However, there is clear moderate to severe generalized akinesia, he briefly seen bilateral hand tremor, and a slow gait with very short steps.    Overall, given his apparent improvement in both mobility and cognition after a routine lumbar puncture, I agree that it seems worthwhile to pursue a lumbar drain trial to more definitively rule in or rule out the  possibility of normal pressure hydrocephalus.  Although his gait changes are not typical for this disorder and his ventriculomegaly is relatively mild, as this condition is treatable I believe it is important to more definitively assess.  If the lumbar drain trial is negative, I think the next most likely diagnosis is Lewy body dementia, although he likewise does not exactly fit the typical character of this disorder as he does not have significant fluctuations in the presence or absence of hallucinations is unclear.    While this work-up is ongoing, I think it is worthwhile to try a higher dose of carbidopalevodopa, as patients with causes of parkinsonism other than idiopathic Parkinson's disease can have some benefit from this medication but often requiring higher doses.      ICD-10-CM    1. Dementia without behavioral disturbance, unspecified dementia type  F03.90    2. Parkinsonism, unspecified Parkinsonism type  G20    3. Urinary incontinence without sensory awareness  N39.42    4. Incontinence of feces, unspecified fecal incontinence type  R15.9        PLAN:     - Increase carbidopa-levodopa 25-100 mg to 1.5 tablets TID as patients with non-PD causes of parkinsonism may require higher doses to achieve symptomatic benefit  - Agree with planned neurosurgery visit, lumbar drain trial given apparent improvement in gait/mobility and cognition after lumbar puncture  - Return to clinic after lumbar drain trial, call as needed before then with questions/concerns      Helyn Numbers, MD  Movement Disorders Specialist  [facility]  [facility]    [address]  [address]  [place] ,[place] [address]    [place], [place] [address]  T [phone]   F [phone]   T [phone]   F [phone]    [email or web address]    ----------------------------------------------------------------------------------------------------------------------    More than 50% of  this 55 minute visit was spent counseling the patient on the patient's specific disease state including discussion about the medical condition, diagnostic and treatment options.  We also discussed medication options, indications and side effects. Finally, we discussed specific lifestyle issues specific to the patient's condition including the need for exercise and other lifestyle modifications.

## 2019-05-16 NOTE — Progress Notes (Signed)
Sent pt's prescription to [facility] care as per Dr. Alphonzo Lemmings.

## 2019-05-20 ENCOUNTER — Telehealth (INDEPENDENT_AMBULATORY_CARE_PROVIDER_SITE_OTHER): Payer: Self-pay | Admitting: Neurological Surgery

## 2019-05-20 NOTE — Telephone Encounter (Signed)
I called and left a return vmail to daughter today on her home and mobile numbers (daughter left me a vmail 05/19/19). RE: offer initial consult w/ [facility] on 05/21/19, 9:30am (zoom or in-person), for LP drain consult. [email or web address]

## 2019-06-05 ENCOUNTER — Encounter (INDEPENDENT_AMBULATORY_CARE_PROVIDER_SITE_OTHER): Payer: Self-pay

## 2019-06-05 NOTE — Progress Notes (Signed)
Review of Systems   Constitutional: Negative.    HENT: Negative.    Eyes: Negative.    Respiratory: Negative.    Cardiovascular: Negative.    Gastrointestinal: Negative.    Endocrine: Negative.    Genitourinary: Negative.    Musculoskeletal: Negative.    Skin: Negative.    Allergic/Immunologic: Negative.    Neurological:        Memory loss   Hematological: Negative.    Psychiatric/Behavioral: Positive for dysphoric mood.

## 2019-06-07 NOTE — Progress Notes (Signed)
[facility] Neurosurgery  New Patient Note    Referring MD: Delene Ruffini, MD, previously Dr. Bennie Hind.   Primary Care MD: Kerin Perna    MRN: [id number]    Due to the COVID-19 virus and guidelines, the patient's appointment was conducted over a video consultation (Zoom) for the safety of the patient, other patients, and office staff. Verbal consent has been obtained from Teressa Lower to conduct a video (Zoom) visit encounter to minimize exposure to COVID-19: yes    Due to the patient's preference and  COVID 19 CDC recommendations, the vital signs and exam are deferred at this time. Problem List, Medications, and Allergies reviewed: yes    HPI     Chief Complaint   Patient presents with    Initial Consult     Ref. from Drs. Falconer/Bicksel for LP drain trial     HPI    Patient ID: Danny Sutton is a 78 y.o.  male, DOB 09-13-1940 with parkinsonism currently on Sinemet, with a recent increase in his dosage. He was referred by Dr. Alphonzo Lemmings to discuss his symptoms and evaluation for NPH. He currently  resides at [facility]) and participates in PT/OT/ST. He is total care for ADLs. The entirety of the HPI was provided by his daughter and the nurse at the facility.     Cognitively, he has had a decline in the past 5 years. 1 year history of urinary incontinence that has progressed. His gait has also progressively gotten slower.     On 04/25/19 he had a high volume LP and 30 cc of CSF was taken off. His daughter reports immediately after the LP his walking and mobility had improved and were "quicker" She reports he was more "talkative" and engaged. The following Monday when his daughter visited, she saw no significant physical or cognitive changes. The nursing staff at the facility denies any significant changes post LP.     He is retired.     Medical History     Past Medical History:   Diagnosis Date    Alzheimer disease     Dementia     Hypertension     Prostate cancer     Prostate cancer         Surgical History   History reviewed. No pertinent surgical history.    Family History     Family History   Problem Relation Age of Onset    No known problems Mother         Social History     Social History     Socioeconomic History    Marital status: Married     Spouse name: Not on file    Number of children: Not on file    Years of education: Not on file    Highest education level: Not on file   Occupational History    Not on file   Social Needs    Financial resource strain: Not on file    Food insecurity     Worry: Not on file     Inability: Not on file    Transportation needs     Medical: Not on file     Non-medical: Not on file   Tobacco Use    Smoking status: Never Smoker    Smokeless tobacco: Never Used   Substance and Sexual Activity    Alcohol use: No    Drug use: No    Sexual activity: Not on file  Lifestyle    Physical activity     Days per week: Not on file     Minutes per session: Not on file    Stress: Not on file   Relationships    Social connections     Talks on phone: Not on file     Gets together: Not on file     Attends religious service: Not on file     Active member of club or organization: Not on file     Attends meetings of clubs or organizations: Not on file     Relationship status: Not on file    Intimate partner violence     Fear of current or ex partner: Not on file     Emotionally abused: Not on file     Physically abused: Not on file     Forced sexual activity: Not on file   Other Topics Concern    Not on file   Social History Narrative    Not on file       Current Medications     Current Outpatient Medications   Medication Sig Dispense Refill    carbidopa-levodopa (SINEMET) 25-100 MG per tablet Take 1.5 tablets by mouth 3 (three) times daily 135 tablet 3    Cyanocobalamin (Vitamin B-12) 2500 MCG SL Tab Place 2,500 mcg under the tongue         nystatin (NYSTOP) powder Apply topically as needed Groin      petrolatum (AQUAPHOR) ointment Apply topically as  needed      senna (SENOKOT) 8.6 MG tablet Take 1 tablet by mouth daily as needed for Constipation      sertraline (ZOLOFT) 50 MG tablet Take 100 mg by mouth daily  3     No current facility-administered medications for this visit.        Allergies   No Known Allergies    Review of Systems   Review of Systems  Constitutional: Negative.    HENT: Negative.    Eyes: Negative.    Respiratory: Negative.    Cardiovascular: Negative.    Gastrointestinal: Negative.    Endocrine: Negative.    Genitourinary: Negative.    Musculoskeletal: Negative.    Skin: Negative.    Allergic/Immunologic: Negative.    Neurological:        Memory loss   Hematological: Negative.    Psychiatric/Behavioral: Positive for dysphoric mood.    Physical Examination   VITAL SIGNS:   vitals were not taken for this visit.      Deferred secondary to video consultation     Neurologic Exam Deferred secondary to video consultation     Radiology Interpretation   [facility] Al[facility] Aly                                 [facility]    MAKS, CAVALLERO PERFORMED AT:  [id number]       DOB:04/04/1941                 [address]  06/11/2018     AGE:64   Gender:M              Physicians Only: [phone]  Moreen Fowler MD                                 [W,H]       [address]       [place], [place] [address]        MRI BRAIN WITHOUT CONTRAST    CLINICAL HISTORY: Dementia. Query normal pressure hydrocephalus.    TECHNIQUE: On a 3 Tesla magnet, the brain was imaged in orthogonal planes  without the administration of intravenous gadolinium.    Comparison is made to MRI of the brain dated 07/31/2016.     FINDINGS:  There is no restricted diffusion to suggest acute ischemia or infarction.    There is mild ventricular prominence in comparison to sulcal prominence, a  finding which has slightly progressed in the interim between examinations.  For reference, the  third ventricle measures approximately 9 mm in greatest  diameter, previously measuring up to 8 mm.    Confluent periventricular as well as scattered deep and subcortical T2  hyperintensity is nonspecific, but likely reflects the sequela of chronic  small vessel ischemic disease. No intracranial hemorrhage, midline shift or  mass-effect is present. No extra-axial fluid collections are seen.   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 There is no  surrounding edema.    The orbital contents are unremarkable. There is mild mucosal thickening  within the maxillary sinuses bilaterally.    IMPRESSION:        1.  There is mild ventricular prominence in comparison to sulcal  prominence, a finding which has slightly progressed in the interim between  examinations. For reference, the third ventricle measures approximately 9  mm in greatest diameter, previously measuring up to 8 mm. Findings may  represent sequelae of cerebral volume loss, a communicating hydrocephalus  could have a similar appearance. Clinical correlation is recommended.    2. 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    Electronically signed by: Neldon Mc M.D.  [Interpreted at: 15PRSP]  [facility], [facility]    NP: 06/12/18    The images above were personally reviewed and discussed in detail with the patient.     Impression   78 y.o. male with parkinsonian on Sinemet with a 5 year progression of cognitive  decline, 1 year history of decline in physical mobility and incontinence. Questionable improvement after high volume LP. No objective data was obtained with PT/ST post LP.     Plan   1. Recommend LP trail for NPH (3 high volume LP)   2. Pre op labs (PT, INR, APTT, platelet count, COVID-19)  3. ST/PT evaluation, baseline assessment and then post LP X 3   4. Follow up 1 week post LP to discuss symptoms.     Follow-up   Post LP     All relevant and clinical information was transcribed by me, Luciana Axe,  NP, acting as a scribe for Dr. Erasmo Score.      Luciana Axe, NP   Levie Heritage, MD

## 2019-06-09 ENCOUNTER — Telehealth (INDEPENDENT_AMBULATORY_CARE_PROVIDER_SITE_OTHER): Payer: Medicare Other | Admitting: Neurological Surgery

## 2019-06-09 ENCOUNTER — Encounter (INDEPENDENT_AMBULATORY_CARE_PROVIDER_SITE_OTHER): Payer: Self-pay | Admitting: Neurological Surgery

## 2019-06-09 DIAGNOSIS — Z01812 Encounter for preprocedural laboratory examination: Secondary | ICD-10-CM

## 2019-06-09 DIAGNOSIS — R4189 Other symptoms and signs involving cognitive functions and awareness: Secondary | ICD-10-CM

## 2019-06-09 DIAGNOSIS — G9389 Other specified disorders of brain: Secondary | ICD-10-CM

## 2019-06-13 DIAGNOSIS — G9389 Other specified disorders of brain: Secondary | ICD-10-CM | POA: Insufficient documentation

## 2019-06-13 DIAGNOSIS — Z01812 Encounter for preprocedural laboratory examination: Secondary | ICD-10-CM | POA: Insufficient documentation

## 2019-07-01 ENCOUNTER — Encounter (INDEPENDENT_AMBULATORY_CARE_PROVIDER_SITE_OTHER): Payer: Self-pay | Admitting: Neurological Surgery

## 2019-07-01 NOTE — Progress Notes (Signed)
Review of Systems   Constitutional: Negative.    HENT: Negative.    Eyes: Negative.    Respiratory: Negative.    Cardiovascular: Negative.    Gastrointestinal: Negative.    Endocrine: Negative.    Genitourinary: Negative.    Musculoskeletal: Negative.    Skin: Negative.    Allergic/Immunologic: Negative.    Neurological:        Memory loss   Hematological: Negative.    Psychiatric/Behavioral: Positive for dysphoric mood.

## 2019-07-08 ENCOUNTER — Telehealth (INDEPENDENT_AMBULATORY_CARE_PROVIDER_SITE_OTHER): Payer: Self-pay | Admitting: Nurse Practitioner

## 2019-07-08 NOTE — Telephone Encounter (Signed)
07/07/18  I called daughter to offer zoom appointment on 02/04 at 9:45am. She will call me back let me know if that works. Gwen Pounds

## 2019-07-21 ENCOUNTER — Ambulatory Visit: Payer: Medicare Other

## 2019-07-21 ENCOUNTER — Ambulatory Visit
Admission: RE | Admit: 2019-07-21 | Discharge: 2019-07-21 | Disposition: A | Discharge status: Home / Self Care | Payer: Medicare Other | Source: Ambulatory Visit | Attending: Nurse Practitioner | Admitting: Nurse Practitioner

## 2019-07-21 ENCOUNTER — Ambulatory Visit: Payer: Medicare Other | Attending: Nurse Practitioner

## 2019-07-21 ENCOUNTER — Other Ambulatory Visit (INDEPENDENT_AMBULATORY_CARE_PROVIDER_SITE_OTHER): Payer: Self-pay | Admitting: Nurse Practitioner

## 2019-07-21 ENCOUNTER — Telehealth (INDEPENDENT_AMBULATORY_CARE_PROVIDER_SITE_OTHER): Payer: Self-pay

## 2019-07-21 DIAGNOSIS — R4189 Other symptoms and signs involving cognitive functions and awareness: Secondary | ICD-10-CM | POA: Insufficient documentation

## 2019-07-21 DIAGNOSIS — Z01812 Encounter for preprocedural laboratory examination: Secondary | ICD-10-CM

## 2019-07-21 DIAGNOSIS — Z9181 History of falling: Secondary | ICD-10-CM | POA: Insufficient documentation

## 2019-07-21 DIAGNOSIS — G9389 Other specified disorders of brain: Secondary | ICD-10-CM

## 2019-07-21 DIAGNOSIS — R791 Abnormal coagulation profile: Secondary | ICD-10-CM | POA: Insufficient documentation

## 2019-07-21 DIAGNOSIS — F809 Developmental disorder of speech and language, unspecified: Secondary | ICD-10-CM | POA: Insufficient documentation

## 2019-07-21 DIAGNOSIS — G912 (Idiopathic) normal pressure hydrocephalus: Secondary | ICD-10-CM | POA: Insufficient documentation

## 2019-07-21 DIAGNOSIS — Z7409 Other reduced mobility: Secondary | ICD-10-CM

## 2019-07-21 LAB — PLATELET COUNT: Platelets: 298 10*3/uL (ref 142–346)

## 2019-07-21 LAB — PT AND APTT
PT INR: 0.9 (ref 0.9–1.1)
PT: 12.4 s — ABNORMAL LOW (ref 12.6–15.0)
PTT: 30 s (ref 23–37)

## 2019-07-21 MED ORDER — LIDOCAINE HCL (PF) 1 % IJ SOLN
10.00 mg | Freq: Once | INTRAMUSCULAR | Status: AC
Start: 2019-07-21 — End: 2019-07-21
  Administered 2019-07-21: 10 mg via INTRAVENOUS

## 2019-07-21 NOTE — Progress Notes (Signed)
[facility]  [address], [address]  [place], [place]  [address]  Phone:  [phone]  Fax:  [phone]    PHYSICAL THERAPY DAILY TREATMENT NOTE    PATIENT: Danny Sutton DOB: 1940-09-29   MR #: [id number]  AGE: 79 y.o.    FACILITY PROVIDER #: [id number] PRIMARY MD: Behiri, Amr H, DO    HICN# Medicare Sub. Num: [id number] DIAGNOSES: Other symptoms and signs involving cognitive functions and awareness [R41.89];Other specified disorders of brain [G93.89]      Date of Service PT Received On: 07/21/19   Treatment Time Start Time: 1453 to Stop Time: 1528   Time Calculation Time Calculation (min): 35 min   Visit # PT Visit  PT Visit Number: 2   Units Billed   Therapeutic Interventions  $ PT Therapeutic Activity (97530): 1 unit     Teressa Lower referred for physical therapy services by: Sabino Snipes, NP    Certification period, precautions, medications and allergies and goals copied from initial evaluation - reviewed and reconciled today.  Gardner Candle, PT 07/21/2019    CERTIFICATION PERIOD:  07/21/2019 to 07/25/2019    TREATMENT DIAGNOSIS:  Impaired Mobility and Activities of Daily Living Z74.09      Medications:         Current Outpatient Medications   Medication Sig Dispense Refill    carbidopa-levodopa (SINEMET) 25-100 MG per tablet Take 1.5 tablets by mouth 3 (three) times daily 135 tablet 3    Cyanocobalamin (Vitamin B-12) 2500 MCG SL Tab Place 2,500 mcg under the tongue         nystatin (NYSTOP) powder Apply topically as needed Groin      petrolatum (AQUAPHOR) ointment Apply topically as needed      senna (SENOKOT) 8.6 MG tablet Take 1 tablet by mouth daily as needed for Constipation      sertraline (ZOLOFT) 50 MG tablet Take 100 mg by mouth daily  3     Precautions:   Fall Risk  Recent Neurological Intervention - Monitor for decline in status.  Significant cognitive deficits - difficulty following multi-step commands, poor historian      Allergies:  No Known Allergies    Long Term Goals:  To be met by 07/25/2019  1. Patient will improve score on the TUG from 49.02 seconds to 32 seconds or less to decrease risk for falls and increase safety with functional mobility. - NOT MET 07/21/2019  2. Patient will improve score on the Tinetti from 24/28 to 26/28 to decrease risk for falls and increase safety with functional mobility. - NOT MET 07/21/2019    EXAMINATION & CLINICAL FINDINGS:   Patient presents for physical therapy today with daughter.   Patient greeted on mat following procedure, handoff from speech therapist completed.     Subjective Report:   Patient does not provide subjective report this afternoon. Patient's daughter reports that it is normal for patient to be overwhelmed after a lot of procedures and appointments and become more disoriented in the afternoon. She reports, "how he is acting now is not outside of the normal for him in the afternoon and when he has had a lot of medical appointments."     Patient does not report or present with:   Vomiting   Headache   Vision problems   Irritability and/or tiredness   Swelling along shunt tract   Personality change   Loss of coordination of balance   Difficulty waking up  or staying awake (this symptom requires urgent attention as it can potentially lead to a coma)   Fever, potentially present with shunt failure or infection   Redness along shunt tract, potentially present with shunt failure or infection   Difficulty walking/Gait disturbances   Cognitive challenges/Mild dementia   Urinary Urgency or incontinence   Feeling unwell   Neck stiffness   Painful eyes  [email or web address]    Pain:    Unable to report pain when asked but patient does not demonstrate any grimacing or nonverbal signs of pain with movement or at rest.     INTERVENTION:  THERAPEUTIC ACTIVITIES: 35 minutes:    Vitals Assessed:  Unable to assess vitals due to patient  constantly moving during assessment, unable to don blood pressure cuff.     Clinical Observations:  Patient on mat and smiling and appears in no distress. Patient does not appear sick. He has not had a bowel or bladder accident. His strength is normal in B UE and B LE. His pupils are equal in size and reactive to light bilaterally.      No neurological decline observed with clinical testing today.     Upon inspection, no evidence of:   Elevated temperature   Clear CSF leaking from wound   Surgical site swelling / break down    Functional Outcomes Assessed:  Unable to safely assess TUG or Tinetti Balance Assessment this afternoon session due to patient being unable to follow simple commands in any capacity.     Patient Education:   Patient's daughter was educated that if patient experiencing any symptoms of hydrocephalus such as vomiting, headache, or bowel and bladder changes that he should go to the ED immediately. Patient's daughter verbalized understanding of education.     EVALUATION  Danny Sutton is a 79 y.o. male presenting to clinic today with unknown improvement in trends to reduce falls risk as patient could not cognitively participate in reassessment of objective outcome measures due to confusion and inability to follow simple motor commands in any capacity. Patient did not present with any signs of declining neurologic status but did present with worsening difficulty following commands and confusion following lumbar puncture. However, this is likely due to time of day and fatigue due to long procedure and multiple different appointments today per his daughter.     Post Surgical Clinical Outcome Trends:  OUTCOME  MEASURE DATE:  07/21/2019 DATE: DATE:   TUG Unable to assess.      Tinetti Unable to assess.      FALL RISK? Unable to determine.        PLAN:  Patient will require continued monitoring to identify trends toward recovery and predict increased risk for falls in order to intervene in a timely  manner to prevent injury.    Therapist Signature:    Estill Dooms, PT, DPT     07/21/2019

## 2019-07-21 NOTE — SLP Eval Note (Signed)
[facility]  [address] [place] [address]  Phone: [phone]      Fax: [phone]    SPEECH THERAPY EVALUATION AND PLAN OF CARE  Normal Pressure Hydrocephalus    *I agree to the below stated plan of care*                  Physician Signature      Date    REFERRING MD:  Sabino Snipes, NP    PATIENT: Danny Sutton DOB: 07-Feb-1941   MR #: [id number]  AGE: 79 y.o.    FACILITY PROVIDER #: [id number] PRIMARY MD: Kerin Perna, DO    CERTIFICATION DATES:     SLP Received On: 07/21/19 to 07/23/2019 START OF CARE:  SLP Received On: 07/21/19   DIAGNOSES:  Other symptoms and signs involving cognitive functions and awareness [R41.89];Other specified disorders of brain [G93.89]  TREATMENT DIAGNOSIS: Cognitive Deficit R41.89       Date of Service SLP Received On: 07/21/19   Treatment Time Start Time: 1225 to Stop Time: 1300   Time Calculation Time Calculation (min): 35 min   Visit #  1   Units Billed SLP Evaluations  $ Eval Speech Sound Prod w/Compre[employer]/Express [phone]): 1 Procedure       Order received for Speech-Language Evaluation and Treatment.    Precautions and Contraindications: Fall Risk  No Known Allergies    Medications:  has a current medication list which includes the following prescription(s): carbidopa-levodopa, vitamin b-12, nystatin, petrolatum, senna, and sertraline.    Past Medical/Surgical History:  Past Medical History:   Diagnosis Date    Alzheimer disease     Dementia     Heart murmur     Hypertension     Prostate cancer     Prostate cancer     Prostate cancer      Past Surgical History:   Procedure Laterality Date    PROSTATE SURGERY          Social History:    -lives in a memory care unit; wife at the same facility  -has family in the area  -dependent for ADLS    History of Present Illness: Danny Sutton is a 79 y.o. male   seen for initial evaluation as per NPH protocol.    Prior level of function:    Memory Care Unit    Patient  Goal:    To determine appropriate management of normal pressure hydrocephalus.     SUBJECTIVE REPORT:  Patient arrives with daughter.  Pt non verbal for the first few minutes despite cues to respond to even simple orientation questions.  Pt's daughter reporting that he IS verbal at baseline but tends to respond to simple Wh?s using 1-2 word responses    Pain:    Patient denies pain today.    OBJECTIVE MEASUREMENTS AND CLINICAL OBSERVATIONS:  Observations:    Pt sitting up in wheelchair, alert, though did not respond to simple commands; poor attention to task    As part of the cognitive/linguistic evaluation conducted on all Normal Pressure Hydrocephalus  (NPH), the Montreal Cognitive Assessment (MoCA) was conducted.    The [facility] Cognitive assessment (MoCA) was designed as a screening instrument for mild cognitive dysfunction. It assesses different cognitive domains: attention, concentration, executive function, memory, language, visuoconstructional skills, calculations, and orientation. A score of > or equal to 26 is considered normal. Total possible is 30 points.    Visuospatial/Executive  0/5  Naming                               0/3  Memory                               /no points  Attention                              0/6  Language                            0/3  Abstraction                         0/2  Delayed Recall                   0/5  Orientation                          0/6    TOTAL                               0/30    EVALUATION AND DIAGNOSIS:   Danny Sutton is a 79 y.o. male presents to speech language therapy with primary diagnosis of normal pressure hydrocephalus (NPH) with the intention of completing pre-testing in order to quantify baseline risk for cognitive impairment as well as post-testing following NPH lumbar puncture (draining cerebral spinal fluid from the subarachnoid space) to identify subsequent changes in cognition.     Long Term Goals:  To be met by 07/23/2019  1. Patient will  improve score on the MOCA to demonstrate improvement re: cognitive function and independence with daily living activities.       PLAN:  Recommend skilled intervention in order to monitor response to lumbar puncture and draining of cerebral spinal fluid.  Assessment is a component of the NPH protocol.     [address]454 - SLP Individual Treatment    Frequency of treatment:    4 visits within 1 week    It has been a pleasure to evaluate Danny Sutton.  Please contact me with any questions or concerns regarding this patients evaluation or ongoing therapy.     Therapist Signature:    Doralee Albino. [place], MS CCC-SLP  07/21/2019

## 2019-07-21 NOTE — PT Eval Note (Signed)
[facility]  [address]  [place], [place]  [address]  Phone:  [phone]  Fax:  [phone]    PHYSICAL THERAPY EVALUATION AND PLAN OF CARE        Referred By: Sabino Snipes, NP    *I agree to the plan of care stated below*                                                                                                                                           Physician Signature      Date      PATIENT: Danny Sutton DOB: 20-Aug-1940   MR #: [id number]  AGE: 79 y.o.    FACILITY PROVIDER #: [id number] PRIMARY MD: Behiri, Amr H, DO    HICN# Medicare Sub. 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  Impaired Mobility and Activities of Daily Living Z74.09      Date of Service PT Received On: 07/21/19   Treatment Time Start Time: 1130 to Stop Time: 1211   Time Calculation Time Calculation (min): 41 min   Visit # PT Visit  PT Visit Number: 1   Units Billed PT Evaluation  $ PT Evaluation High Complexity [phone]): 1 Procedure       CERTIFICATION PERIOD:  07/21/2019 to 07/25/2019    TREATMENT DIAGNOSIS:  Impaired Mobility and Activities of Daily Living Z74.09       Medications:  Current Outpatient Medications   Medication Sig Dispense Refill    carbidopa-levodopa (SINEMET) 25-100 MG per tablet Take 1.5 tablets by mouth 3 (three) times daily 135 tablet 3    Cyanocobalamin (Vitamin B-12) 2500 MCG SL Tab Place 2,500 mcg under the tongue         nystatin (NYSTOP) powder Apply topically as needed Groin      petrolatum (AQUAPHOR) ointment Apply topically as needed      senna (SENOKOT) 8.6 MG tablet Take 1 tablet by mouth daily as needed for Constipation      sertraline (ZOLOFT) 50 MG tablet Take 100 mg by mouth daily  3     Precautions:   Fall Risk  Recent Neurological Intervention - Monitor for decline in status.  Significant cognitive deficits - difficulty following multi-step commands, poor  historian     Allergies:  No Known Allergies    EXAMINATION / EVALUATION:    Past Medical/Surgical History:  Past Medical History:   Diagnosis Date    Alzheimer disease     Dementia     Heart murmur     Hypertension     Prostate cancer      Past Surgical History:   Procedure Laterality Date    PROSTATE SURGERY       Social History:  Patient lives in assisted living facility with 24-hour care for all  activities of daily living including bathing, dressing, and meal prep. Patient is retired. He has a daughter and son that live locally and assist taking him to medical appointments.     Home Environment:  Patient lives in an assistive living facility on the memory unit. There are no stairs to enter or inside. He has assistance with all ADL's 24 hours per day. The bathroom setup is a shower stall.     DME Owned:  Shower chair in group home.     Patient presents for physical therapy today with daughter.   Patient ambulates into clinic with handheld assistance from daughter without assistive device.    History of present illness:  Date of Symptom Onset: 2016  Date of Surgery:  07/21/2019    Danny Sutton is a 79 y.o. male with a diagnosis of Normal Pressure Hydrocephalus presenting to physical therapy today for a pre-operative evaluation of baseline fall risk.     Per patient's daughter, patient has been experiencing steady functional and cognitive decline over the past 5 years.     He has been following with Dr Alysia Penna, who recommended he be tested for NPH. He had a lumbar puncture on October 30, and after this his children and staff at his facility thought that there were some subtle improvements in motor function with an easier time getting off the table than on, slightly better gait, and more spontaneous movement like waving goodbye. They thought his cognition was likewise slightly better. His daughter admits this was not a dramatic improvement but was enough to clearly notice.    Subjective Report:   Patient's  daughter reports that patient walks pretty well and most of his deficits have to do with his memory and cognition.     INTERVENTION:  Initial pre-surgery evaluation performed.  Functional outcomes assessed    Vitals Assessed:  Seated. R UE   Blood Pressure: 134/86 mmHg  Pulse: 84 bpm    Patient does not exhibit any contraindications to exercise or activity today.      Functional Outcomes Assessed:  Timed Up and Go and Tinetti    Timed Up and Go (TUG)  PT WILL PLACE TAPE ON FLOOR AT 10 FEET MARK  Directions: Patients wear their regular footwear and can use a walking aid, if needed. Begin by having the patient sit back in a standard arm chair and identify a line 3 meters, or 10 feet away, on the floor.    Instruct the patient:  When I say Go, I want you to:  1. Stand up from the chair.  2. Walk to the line on the floor at your normal pace.   3. Turn.  4. Walk back to the chair at your normal pace.  5. Sit down again.    Timer will start when PT says "Go" and stopped when patient sits back into chair.    Assistive Device / Orthosis: None    Time Trial 1 48.13   Time Trial 2 52.64   Time Trial 3 46.29   Average Time: 49.02     Risk for Falls: Frail elderly:  >32.6 seconds indicates increased risk of falls.  Maisie Fus et al, 2005.         Tinetti Performance Oriented Mobility Assessment:    Task              Score   1  Sitting Balance   1 - Steady, safe   2  Rises from Chair  2 - Able  without using arms   3  Attempts to Rise   2 - Able on first attempt   4  Immediate Standing Balance   2 - Steady without support   5  Standing Balance   2 - Narrow stance without support   6  Nudged   2 - Steady   7  Eyes Closed   1 - Steady   8  Turning 360 Deg   0 - Discontinuous steps   1 - Steady   9 Sitting Down   1 - Uses arms or not a smooth motion   10 Initiation of Gait   0 - Any hesitancy, multiple attempts to start   11 Step Length and Height  1 - R swing foot passes L stance leg  1 - L foot completely clears floor   12 Step  Symmetry   1 - R and L step length equal   13 Step Continuity   1 - Steps appear continuous   14 Path   2 - Straight without assistive device   15 Trunk   2 - None of the above deviations   16 Walking Stance   0 - Heels apart     Balance and Gait Score      24/28   MDC = 4 points  In general, patients who score below 19 are at high risk for falls. Patients who score in the range of 19-24 indicate that the patient has a risk for falls    Patient Education:   Patient and daughter were educated on goals and benefits of therapy, as well as procedures to be performed and schedule throughout the day today. Patient and daughter escorted to registration at radiology department. Patient's daughter verbalized understanding of all education.     EVALUATION AND DIAGNOSIS:   Mareon Viviano is a 79 y.o. male presents to physical therapy with primary diagnosis of normal pressure hydrocephalus (NPH) with the intention of completing pre-testing in order to quantify baseline risk for falls as well as post-testing following NPH lumbar puncture (draining cerebral spinal fluid from the subarachnoid space) to identify subsequent changes in fall risk. Patient actively participated in pre-surgical testing today. He required consistent redirection and cuing to stay on task during testing today due to previously known cognitive deficits. Plan to perform follow up resting after surgical intervention.     Long Term Goals:  To be met by 07/25/2019  1. Patient will improve score on the TUG from 49.02 seconds to 32 seconds or less to decrease risk for falls and increase safety with functional mobility.  2. Patient will improve score on the Tinetti from 24/28 to 26/28 to decrease risk for falls and increase safety with functional mobility.    PLAN:  Recommend skilled intervention to continue to monitor trends toward recovery and predict increased risk for falls in order to intervene in a timely manner to prevent injury.    [address] Therapeutic  Activities, to include functional mobility training.    Frequency of treatment:    4 visits within 1 week    It has been a pleasure to evaluate Teressa Lower.  Please contact me with any questions or concerns regarding this patients evaluation or ongoing therapy.     Therapist Signature:    Estill Dooms, PT, DPT       07/21/2019      CPT Evaluation Code Justification  CRITERIA JUSTIFICATION DESCRIPTION   Personal factors and/or co-morbidities that impact the plan of  care. Factors that affect plan of care include:  - Previously known cognitive deficits  - Recent cognitive and physical decline   - Cognitive status fluctuating throughout the day per daughter report 3 (High Complexity)     Body Systems Examined Impairments noted in:  - Communication   - Balance   - Cognition  - Strength  4 or more elements (High Complexity)      Clinical Presentation Presentation is unstable varying greatly. Unstable (High Complexity)     Clinical Decision Making Standardized Assessment used:  See above for details. Evaluation Code:  High Complexity

## 2019-07-21 NOTE — SLP Progress Note (Signed)
[facility]  [address] [place] [address]  Phone: [phone]      Fax: [phone]    SPEECH THERAPY TREATMENT NOTE  Normal Pressure Hydrocephalus      PATIENT: Danny Sutton DOB: 09-09-1940   MR #: [id number]  AGE: 79 y.o.    FACILITY PROVIDER #: [id number] PRIMARY MD: Behiri, Amr H, DO    HICN# Medicare Sub. Num: [id number] DIAGNOSES: Other symptoms and signs involving cognitive functions and awareness [R41.89];Other specified disorders of brain [G93.89]      Date of Service  07/21/2019   Treatment Time  1435  to  1450   Time Calculation  15 minutes   Visit #  2   Units Billed    1     Danny Sutton referred for speech therapy services by: Danny Snipes, NP    Certification period, precautions, medications and allergies and goals copied from initial evaluation - reviewed and reconciled today.  Danny Sutton, SLP 07/21/2019    Precautions and Contraindications: Fall Risk  No Known Allergies    Long Term Goals:    1. Patient will improve score on the MOCA to demonstrate improved cognitive function and independence with daily living activities.     GOAL# Progress Toward Goals   1 Progressing- slight     Subjective Report:   Pt's daughter reporting that pt not at baseline.    Patient does not report:   Vomiting   Headache   Vision problems   Irritability and/or tiredness   Swelling along shunt tract   Personality change   Loss of coordination of balance   Difficulty waking up or staying awake (this symptom requires urgent attention as it can potentially lead to a coma)   Fever, potentially present with shunt failure or infection   Redness along shunt tract, potentially present with shunt failure or infection   Difficulty walking/Gait disturbances   Cognitive challenges/Mild dementia   Urinary Urgency or incontinence   Feeling unwell   Neck stiffness   Painful  eyes  [email or web address]      Pain:    Patient denies pain today.    Clinical Observations:  Pt with improved ability to attend to clinician this afternoon.   No neurological decline observed with clinical testing today.   Upon inspection, no evidence of:   Elevated temperature   Clear CSF leaking from wound   Surgical site swelling / break down    OBJECTIVE MEASUREMENTS AND CLINICAL OBSERVATIONS:    NOTE: it should be noted that though there was no appreciable impairment on objective assessment, pt's affect had improved, and he more easily engaged in both structured and unstructured tasks.   Slightly improved attention/concentration this pm. More alert and appropriate though continued very minimal verbalizations.    As part of the cognitive/linguistic evaluation conducted on all Normal Pressure Hydrocephalus  (NPH), the Montreal Cognitive Assessment (MoCA) was conducted.    The [facility] Cognitive assessment (MoCA) was designed as a screening instrument for mild cognitive dysfunction. It assesses different cognitive domains: attention, concentration, executive function, memory, language, visuoconstructional skills, calculations, and orientation. A score of > or equal to 26 is considered normal. Total possible is 30 points.    Visuospatial/Executive       0/5  Naming                               1/3  Memory                              /no points  Attention                            0/6  Language                            0/3  Abstraction                         0/2  Delayed Recall                   0/5  Orientation                         0/6    TOTAL                               1/30    Patient Education:   Patient educated on the NPH protocol   Pt. did not verbalize an understanding of this education  though his daughter did comprehend and asked appropriate questions      EVALUATION  Danny Sutton is a 79 y.o. male presenting to clinic today with improving trends  towards reduced risk to fall.       Post Surgical Clinical Outcome Trends:  OUTCOME  MEASURE DATE:07/21/2019 am   DATE: 07/21/2019 pm DATE: DATE:   MoCA 0/30 1/30       PLAN:  Patient will require continued monitoring to identify trends toward recovery and assess cognitive change.     Therapist Signature:    Danny Sutton. Danny [place], MS CCC-SLP  07/21/2019

## 2019-07-22 ENCOUNTER — Ambulatory Visit: Payer: Medicare Other

## 2019-07-22 ENCOUNTER — Ambulatory Visit
Admission: RE | Admit: 2019-07-22 | Discharge: 2019-07-22 | Disposition: A | Discharge status: Home / Self Care | Payer: Medicare Other | Source: Ambulatory Visit | Attending: Nurse Practitioner | Admitting: Nurse Practitioner

## 2019-07-22 DIAGNOSIS — R4189 Other symptoms and signs involving cognitive functions and awareness: Secondary | ICD-10-CM

## 2019-07-22 DIAGNOSIS — G9389 Other specified disorders of brain: Secondary | ICD-10-CM

## 2019-07-22 DIAGNOSIS — G912 (Idiopathic) normal pressure hydrocephalus: Secondary | ICD-10-CM | POA: Insufficient documentation

## 2019-07-22 DIAGNOSIS — Z789 Other specified health status: Secondary | ICD-10-CM

## 2019-07-22 MED ORDER — LIDOCAINE HCL (PF) 1 % IJ SOLN
50.00 mg | Freq: Once | INTRAMUSCULAR | Status: AC
Start: 2019-07-22 — End: 2019-07-22
  Administered 2019-07-22: 50 mg via INTRAVENOUS

## 2019-07-22 NOTE — SLP Progress Note (Signed)
[facility]  [address] [place] [address]  Phone: [phone]      Fax: [phone]    SPEECH THERAPY TREATMENT NOTE  Normal Pressure Hydrocephalus      PATIENT: Danny Sutton DOB: 06/07/1941   MR #: [id number]  AGE: 79 y.o.    FACILITY PROVIDER #: [id number] PRIMARY MD: Behiri, Amr H, DO    HICN# Medicare Sub. Num: [id number] DIAGNOSES: Other symptoms and signs involving cognitive functions and awareness [R41.89];Other specified disorders of brain [G93.89]      Date of Service  07/22/2019   Treatment Time  1430  to  1450   Time Calculation  20 minutes   Visit #  3   Units Billed    1     Teressa Lower referred for speech therapy services by: Sabino Snipes, NP    Certification period, precautions, medications and allergies and goals copied from initial evaluation - reviewed and reconciled today.  [name], SLP 07/22/2019    Precautions and Contraindications: Fall Risk  No Known Allergies    Long Term Goals:  To be met by 07/23/2019  1. Patient will improve score on the MOCA to indicate improved cognitive function and independence with daily living activities.     GOAL# Progress Toward Goals   1 Progressing     Subjective Report:   Patient's daughter reported that pt has been more alert this afternoon as compared to baseline    Patient does not report:   Vomiting   Headache   Vision problems   Irritability and/or tiredness   Swelling along shunt tract   Personality change   Loss of coordination of balance   Difficulty waking up or staying awake (this symptom requires urgent attention as it can potentially lead to a coma)   Fever, potentially present with shunt failure or infection   Redness along shunt tract, potentially present with shunt failure or infection   Difficulty walking/Gait disturbances   Cognitive challenges/Mild dementia   Urinary Urgency or incontinence   Feeling unwell   Neck stiffness   Painful  eyes  [email or web address]      Pain:    Patient denies pain today.    Clinical Observations:  Pt alert, sitting up in a wheelchair  No neurological decline observed with clinical testing today.   Upon inspection, no evidence of:   Elevated temperature   Clear CSF leaking from wound   Surgical site swelling / break down    OBJECTIVE MEASUREMENTS AND CLINICAL OBSERVATIONS:    As part of the cognitive/linguistic evaluation conducted on all Normal Pressure Hydrocephalus  (NPH), the Montreal Cognitive Assessment (MoCA) was conducted.    The [facility] Cognitive assessment (MoCA) was designed as a screening instrument for mild cognitive dysfunction. It assesses different cognitive domains: attention, concentration, executive function, memory, language, visuoconstructional skills, calculations, and orientation. A score of > or equal to 26 is considered normal. Total possible is 30 points.    Visuospatial/Executive        0/5  Naming                              1/3  Memory                             no points  Attention  0/6  Language                           0/3  Abstraction                         0/2  Delayed Recall                   0/5  Orientation                         0/6    TOTAL                                1/30 (impaired)    It should be noted that though pt not able to attend to tasks on the MoCA, subjectively  he did present with improved alertness, and ability to respond to basic orientation questions requiring 1 word responses. Pt also followed 1 step commands slightly more consistently as compared to last session.    Patient Education:   Patient educated on progress    Pt. did not verbalize an understanding of this education though his daughter did.      EVALUATION  Kendan Yokoyama is a 79 y.o. male presenting to clinic today with improving trends towards reduced risk to fall.       Post Surgical Clinical Outcome  Trends:  OUTCOME  MEASURE DATE: 07/21/2019 am   DATE:  07/21/2019 pm DATE: 07/22/2019 pm DATE:   MoCA 0/30 1/30 1/30      PLAN:  Patient will require continued monitoring to identify trends toward recovery and assess cognitive change.     Therapist Signature:    Doralee Albino. [place], MS CCC-SLP  07/22/2019

## 2019-07-23 ENCOUNTER — Ambulatory Visit: Payer: Medicare Other

## 2019-07-23 ENCOUNTER — Ambulatory Visit
Admission: RE | Admit: 2019-07-23 | Discharge: 2019-07-23 | Disposition: A | Discharge status: Home / Self Care | Payer: Medicare Other | Source: Ambulatory Visit | Attending: Nurse Practitioner | Admitting: Nurse Practitioner

## 2019-07-23 DIAGNOSIS — R4189 Other symptoms and signs involving cognitive functions and awareness: Secondary | ICD-10-CM

## 2019-07-23 DIAGNOSIS — G9389 Other specified disorders of brain: Secondary | ICD-10-CM

## 2019-07-23 DIAGNOSIS — Z789 Other specified health status: Secondary | ICD-10-CM

## 2019-07-23 DIAGNOSIS — G912 (Idiopathic) normal pressure hydrocephalus: Secondary | ICD-10-CM | POA: Insufficient documentation

## 2019-07-23 MED ORDER — LIDOCAINE HCL (PF) 1 % IJ SOLN
50.00 mg | Freq: Once | INTRAMUSCULAR | Status: AC
Start: 2019-07-23 — End: 2019-07-23
  Administered 2019-07-23: 50 mg via INTRAVENOUS

## 2019-07-23 NOTE — Progress Notes (Signed)
[facility]  [address], [address]  [place], [place]  [address]  Phone:  [phone]  Fax:  [phone]    PHYSICAL THERAPY DAILY TREATMENT NOTE    PATIENT: Danny Sutton DOB: Dec 04, 1940   MR #: [id number]  AGE: 79 y.o.    FACILITY PROVIDER #: [id number] PRIMARY MD: Behiri, Amr H, DO    HICN# Medicare Sub. Num: [id number] DIAGNOSES: Other symptoms and signs involving cognitive functions and awareness [R41.89];Other specified disorders of brain [G93.89]      Date of Service PT Received On: 07/22/19   Treatment Time Start Time: 1414 to Stop Time: 1432   Time Calculation Time Calculation (min): 18 min   Visit # PT Visit  PT Visit Number: 3   Units Billed   Therapeutic Interventions  $ PT Therapeutic Activity (97530): 1 unit     Danny Sutton referred for physical therapy services by: Danny Snipes, NP    Certification period, precautions, medications and allergies and goals copied from initial evaluation - reviewed and reconciled today.  Danny Sutton, PT 07/23/2019    CERTIFICATION PERIOD:  07/21/2019 to 07/25/2019    TREATMENT DIAGNOSIS:  Impaired Mobility and Activities of Daily Living Z74.09      Medications:         Current Outpatient Medications   Medication Sig Dispense Refill    carbidopa-levodopa (SINEMET) 25-100 MG per tablet Take 1.5 tablets by mouth 3 (three) times daily 135 tablet 3    Cyanocobalamin (Vitamin B-12) 2500 MCG SL Tab Place 2,500 mcg under the tongue         nystatin (NYSTOP) powder Apply topically as needed Groin      petrolatum (AQUAPHOR) ointment Apply topically as needed      senna (SENOKOT) 8.6 MG tablet Take 1 tablet by mouth daily as needed for Constipation      sertraline (ZOLOFT) 50 MG tablet Take 100 mg by mouth daily  3     Precautions:   Fall Risk  Recent Neurological Intervention - Monitor for decline in status.  Significant cognitive deficits - difficulty following multi-step commands, poor historian      Allergies:  No Known Allergies    Long Term Goals:  To be met by 07/25/2019  1. Patient will improve score on the TUG from 49.02 seconds to 32 seconds or less to decrease risk for falls and increase safety with functional mobility. - NOT MET 07/21/2019  2. Patient will improve score on the Tinetti from 24/28 to 26/28 to decrease risk for falls and increase safety with functional mobility. - NOT MET 07/21/2019    EXAMINATION & CLINICAL FINDINGS:   Patient presents for physical therapy today with daughter.   Patient greeted on mat following procedure.    Subjective Report:   Patient denies pains. Denies headache. No complaints.  Follows directions with repeated prompting and cueing.      Patient does not report or present with:   Vomiting   Headache   Vision problems   Irritability and/or tiredness   Swelling along shunt tract   Personality change   Loss of coordination of balance   Difficulty waking up or staying awake (this symptom requires urgent attention as it can potentially lead to a coma)   Fever, potentially present with shunt failure or infection   Redness along shunt tract, potentially present with shunt failure or infection   Difficulty walking/Gait disturbances   Cognitive challenges/Mild dementia  Urinary Urgency or incontinence   Feeling unwell   Neck stiffness   Painful eyes  [email or web address]    Pain:    Dies pain when asked.     INTERVENTION:  THERAPEUTIC ACTIVITIES: 18 minutes:    Vitals Assessed:  BP 133/71  HR 64bpm    Clinical Observations:    Supine on mat. No apparent distress.       No neurological decline observed with clinical testing today.     Upon inspection, no evidence of:   Elevated temperature   Clear CSF leaking from wound   Surgical site swelling / break down    Functional Outcomes Assessed:Timed Up and Go and Tinetti    Timed Up and Go (TUG)  PT WILL PLACE TAPE ON FLOOR AT 10 FEET MARK  Directions: Patients wear  their regular footwear and can use a walking aid, if needed. Begin by having the patient sit back in a standard arm chair and identify a line 3 meters, or 10 feet away, on the floor.    Instruct the patient:  When I say Go, I want you to:  1. Stand up from the chair.  2. Walk to the line on the floor at your normal pace.   3. Turn.  4. Walk back to the chair at your normal pace.  5. Sit down again.    Timer will start when PT says "Go" and stopped when patient sits back into chair.    Assistive Device / Orthosis: None    Time Trial 1 59.0   Time Trial 2 67.0   Time Trial 3 46.0   Average Time: 57.33     Risk for Falls: Frail elderly:  >32.6 seconds indicates increased risk of falls.  Danny Sutton et al, 2005.        Tinetti Performance Oriented Mobility Assessment:   Task             Score   1 Sitting Balance  1 - Steady, safe   2 Rises from Chair  2 - Able without using arms   3 Attempts to Rise  2 - Able on first attempt   4 Immediate Standing Balance 2 - Steady without support   5 Standing Balance 2 - Narrow stance without support   6 Nudged 2 - Steady   7 Eyes Closed 1 - Steady   8 Turning 360 Deg 0 - Discontinuous steps   1 - Steady   9 Sitting Down 1 - Uses arms or not a smooth motion   10 Initiation of Gait 0 - Any hesitancy, multiple attempts to start   11 Step Length and Height  1 - R swing foot passes L stance leg  1 - L foot completely clears floor   12 Step Symmetry 1 - R and L step length equal   13 Step Continuity 1 - Steps appear continuous   14 Path 2 - Straight without assistive device   15 Trunk  2 - None of the above deviations   16 Walking Stance 0 - Heels apart    Balance and Gait Score    24/28   MDC = 4 points  In general, patients who score below 19 are at high risk for falls. Patients who score in the range of 19-24 indicate that the patient has a risk for falls      Patient Education:   Reviewed NPH protocol.     EVALUATION  Danny Sutton is a 79 y.o.  male presenting with minimal change in status. DIfficult to get a true assessment of physical and functional mobility due to cognitive deficits, distractibility, and difficulty following commands.     Post Surgical Clinical Outcome Trends:  OUTCOME  MEASURE DATE:  07/21/2019  Pre-Procedure DATE:  07/21/2019  Post-Procedure DATE:  07/22/2019  Post-Procedure DATE:   TUG 49.02 Unable to assess.  57.33    Tinetti 24/28 Unable to assess.  24/28    FALL RISK? Yes Unable to determine.  Yes      PLAN:  Patient will require continued monitoring to identify trends toward recovery and predict increased risk for falls in order to intervene in a timely manner to prevent injury.    Therapist Signature:    Jahari Wiginton A. Tyson Babinski, PT, DPT, NCS       07/23/2019

## 2019-07-23 NOTE — SLP Progress Note (Signed)
[facility]  [address] [place] [address]  Phone: [phone]      Fax: [phone]    SPEECH THERAPY TREATMENT NOTE  Normal Pressure Hydrocephalus      PATIENT: Danny Sutton DOB: 10-21-1940   MR #: [id number]  AGE: 79 y.o.    FACILITY PROVIDER #: [id number] PRIMARY MD: Behiri, Amr H, DO    HICN# Medicare Sub. Num: [id number] DIAGNOSES: Other symptoms and signs involving cognitive functions and awareness [R41.89];Other specified disorders of brain [G93.89]      Date of Service  07/23/2019   Treatment Time  1425  to  1440   Time Calculation  15 minutes   Visit #  4   Units Billed    1     Teressa Lower referred for speech therapy services by: Sabino Snipes, NP    Certification period, precautions, medications and allergies and goals copied from initial evaluation - reviewed and reconciled today.  [name], SLP 07/23/2019    Precautions and Contraindications: Fall Risk  No Known Allergies    Long Term Goals:  To be met by 07/23/2019  1. Patient will improve score on the MOCA to represent improved cognitive function and independence with daily living activities.     GOAL# Progress Toward Goals   1 Progressing     Subjective Report:   Patient's son in law present and reporting that pt always appears 'brighter' with improved affect following LP    Patient does not report:   Vomiting   Headache   Vision problems   Irritability and/or tiredness   Swelling along shunt tract   Personality change   Loss of coordination of balance   Difficulty waking up or staying awake (this symptom requires urgent attention as it can potentially lead to a coma)   Fever, potentially present with shunt failure or infection   Redness along shunt tract, potentially present with shunt failure or infection   Difficulty walking/Gait disturbances   Cognitive challenges/Mild dementia   Urinary Urgency or incontinence   Feeling unwell   Neck stiffness   Painful  eyes  [email or web address]        Pain:    Patient denies pain today.    Clinical Observations:  Alert, sitting up on bed  No neurological decline observed with clinical testing today.   Upon inspection, no evidence of:   Elevated temperature   Clear CSF leaking from wound   Surgical site swelling / break down    OBJECTIVE MEASUREMENTS AND CLINICAL OBSERVATIONS:    As part of the cognitive/linguistic evaluation conducted on all Normal Pressure Hydrocephalus  (NPH), the Montreal Cognitive Assessment (MoCA) was conducted.    The [facility] Cognitive assessment (MoCA) was designed as a screening instrument for mild cognitive dysfunction. It assesses different cognitive domains: attention, concentration, executive function, memory, language, visuoconstructional skills, calculations, and orientation. A score of > or equal to 26 is considered normal. Total possible is 30 points.    Visuospatial/Executive        1/5  Naming                               0/3  Memory                              /no points  Attention  0/6  Language                            0/3  Abstraction                          0/2  Delayed Recall                   0/5  Orientation                         1/6    TOTAL                                2/30    Patient Education:   Patient's son in law educated on NPH protocol  Pt. did verbalize an understanding of this education       EVALUATION  Danny Sutton is a 79 y.o. male presenting to clinic today with improving trends towards reduced risk to fall.       Post Surgical Clinical Outcome Trends:  OUTCOME  MEASURE DATE: 07/21/2019  am DATE:07/21/2019 pm DATE:07/22/2019 pm DATE: 07/23/2019 pm   MoCA 0/30 1/30 1/30 2/30     PLAN:  -Patient will require continued monitoring to identify trends toward recovery and assess cognitive change.   -Suggest a trial of cognitive/linguistic dx/tx at pt's ALF as appropriate    Therapist Signature:     Doralee Albino. [place], MS CCC-SLP  07/23/2019

## 2019-07-23 NOTE — Discharge Summary (Signed)
[facility]  [address]  [place], [place]  [address]  Phone:  [phone]  Fax:  [phone]    PHYSICAL THERAPY DAILY TREATMENT NOTE & discharge summary.    PATIENT: Danny Sutton DOB: 22-Aug-1940   MR #: [id number]  AGE: 79 y.o.    FACILITY PROVIDER #: [id number] PRIMARY MD: Behiri, Amr H, DO    HICN# Medicare Sub. Num: [id number] DIAGNOSES: Other symptoms and signs involving cognitive functions and awareness [R41.89];Other specified disorders of brain [G93.89]      Date of Service PT Received On: 07/23/19   Treatment Time Start Time: 1445 to Stop Time: 1500   Time Calculation Time Calculation (min): 15 min   Visit # PT Visit  PT Visit Number: 4   Units Billed   Therapeutic Interventions  $ PT Therapeutic Activity (97530): 1 unit(15 minutes)     Teressa Lower referred for physical therapy services by: Sabino Snipes, NP    Certification period, precautions, medications and allergies and goals copied from initial evaluation - reviewed and reconciled today.  Augustin Coupe, PT 07/23/2019     CERTIFICATION PERIOD:  1/25/2021to 07/25/2019    TREATMENT DIAGNOSIS:  Impaired Mobility and Activities of Daily Living Z74.09    Medications:         Current Outpatient Medications   Medication Sig Dispense Refill    carbidopa-levodopa (SINEMET) 25-100 MG per tablet Take 1.5 tablets by mouth 3 (three) times daily 135 tablet 3    Cyanocobalamin (Vitamin B-12) 2500 MCG SL Tab Place 2,500 mcg under the tongue         nystatin (NYSTOP) powder Apply topically as needed Groin      petrolatum (AQUAPHOR) ointment Apply topically as needed      senna (SENOKOT) 8.6 MG tablet Take 1 tablet by mouth daily as needed for Constipation      sertraline (ZOLOFT) 50 MG tablet Take 100 mg by mouth daily  3     Precautions:  Fall Risk  Recent Neurological Intervention - Monitor for decline in status.  Significant cognitive deficits - difficulty following  multi-step commands, poor historian    Allergies:  No Known Allergies    Long Term Goals: To be met by 07/25/2019  1. Patient will improve score on the TUG from49.02 secondsto 32 seconds or lessto decrease risk for falls and increase safety with functional mobility.  2. Patient will improve score on the Tinetti from24/28 to26/28 to decrease risk for falls and increase safety with functional mobility.    GOAL# Progress Toward Goals   1 Not met 2/to cognitive status   2 Not met, 25/28 (increased from 24/28)     EXAMINATION & CLINICAL FINDINGS:   Patient presents for physical therapy today with son.   Patient supine on stretcher following procedure.  Patient is cleared by radiology for PT.    Subjective Report:   Patient denies pain, no complaints.    Pain:    Patient denies pain today.    INTERVENTION:  THERAPEUTIC ACTIVITIES: 15 minutes:    Vitals Assessed:  Supine, left upper extremity.    Cuff size:  Adult L   Blood Pressure:  133/79 mmHg, Pulse:  58 bpm    Patient does not exhibit any contraindications to exercise or activity today.      Clinical Observations:  No apparent distress    Functional Outcomes Assessed:  TUG and Tinetti    Timed Up and Go (  TUG)  PT WILL PLACE TAPE ON FLOOR AT 10 FEET MARK  Directions: Patients wear their regular footwear and can use a walking aid, if needed. Begin by having the patient sit back in a standard arm chair and identify a line 3 meters, or 10 feet away, on the floor.    Instruct the patient:  When I say Go, I want you to:  1. Stand up from the chair.  2. Walk to the line on the floor at your normal pace.   3. Turn.  4. Walk back to the chair at your normal pace.  5. Sit down again.    Timer will start when PT says "Go" and stopped when patient sits back into chair.  Assistive Device / Orthosis:    Time Trial 1    66 seconds   Time Trial 2   45 seconds   Time Trial 3   43 seconds   Average Time:   51 seconds    Risk for Falls: Frail elderly:  >32.6 seconds  indicates increased risk of falls.  Maisie Fus et al, 2005.      Tinetti Performance Oriented Mobility Assessment:    Task              Score    1  Sitting Balance   1 - Steady, safe   2  Arises   2 - Able without using arms   3  Attempts to Rise   2 - Able on first attempt   4  Immediate Standing Balance   2 - Steady without support   5  Standing Balance   2 - Narrow stance without support   6  Nudged   2 - Steady   7  Eyes Closed   1 - Steady   8  Turning 360 Deg   0 - Discontinuous steps   1 - Steady   9 Sitting Down   1 - Uses arms or not a smooth motion   10 Initiation of Gait   0 - Any hesitancy, multiple attempts to start   11 Step Length and Height  1 - R swing foot passes L stance leg  1 - L swing foot passes R stance leg   1 - R foot completely clears floor  1 - L foot completely clears floor   12 Step Symmetry   1 - R and L step length equal   13 Step Continuity   1 - Steps appear continuous   14 Path   2 - Straight without assistive device   15 Trunk   2 - None of the above deviations   16 Walking Stance   1 - Heels almost touching with gait     Balance and Gait Score      25/28   MDC = 4 points    In general, patients who score below 19 are at high risk for falls. Patients who score in the range of 19-24 indicate that the patient has a risk for falls    Patient Education:   Patient and son-in-law educated re: review of NPH/lumbar puncture protocol.        EVALUATION  Danny Sutton is a 79 y.o. male presenting to clinic today with minimal change in statue, though did increase TUG from 24/28 to 25/28.   Patient did not meet goals for reduction in fall risk on either measure.  Outcome assessment limited by patient's cognitive status with limited ability to follow directions, increased  processing time, and difficulty maintaining attention to task.     Post Surgical Clinical Outcome Trends:  OUTCOME  MEASURE DATE:  07/21/2019  Pre-Procedure DATE:  07/21/2019  Post-Procedure DATE:  07/22/2019  Post-Procedure DATE:   07/23/2019  Post-procedure   TUG 49.02 Unable to assess.  57.33 51 seconds   Tinetti 24/28 Unable to assess.  24/28 25/28   FALL RISK? Yes Unable to determine.  Yes Yes         PLAN:  D/C PT as NPH/lumbar puncture procedures are complete.  Patient to follow-up with MD.    Therapist Signature:    Raford Pitcher, MS, PT, [facility]  [facility]  [address]  [place], [place] [address]    07/23/2019

## 2019-07-28 NOTE — Progress Notes (Addendum)
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 Questionable improvement after high volume LP. No objective data was obtained with PT/ST post LP.      Plan   1. Recommend LP trail for NPH (3 high volume LP)   2. Pre op labs (PT, INR, APTT, platelet count, COVID-19)  3. ST/PT evaluation, baseline assessment and then post LP X 3   4. Follow up 1 week post LP to discuss symptoms.      Follow-up   Post LP      Due to the COVID-19 virus and guidelines, the patient's appointment was conducted over a video consultation (Zoom) for the safety of the patient, other patients, and office staff. Verbal consent has been obtained from Danny Sutton to conduct a video (Zoom) visit encounter to minimize exposure to COVID-19: yes    Due to the patient's preference and  COVID 19 CDC recommendations, the vital signs and exam are deferred at this time. Problem List, Medications, and Allergies reviewed: yes    PCP: Amr Behiri, DO    HPI     Chief Complaint   Patient presents with    Cognitive decline     2 month f/u post lumbar puncture     Danny Sutton 79 y.o. 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 He underwent a high volume LP trial and a video consultation was provided to discuss the results.       Baseline TUG score: 49.02 seconds  Baseline MoCA: 0/30  Baseline Tinetti score: 24/28    Day #1 TUG score: (UTA)   Day #1 MoCA: 1/30  Day #1 Tinetti score: (UTA)    Day #2 TUG score: 57.33 seconds  Day #2 MoCA: 1/30  Day #2 Tinetti score: 24/28    Day #3 TUG score: 51 seconds  Day #3 MoCA: 2/30  Day #3 Tinetti score: 25/28      Daughter/son in law reported on the results of the trial to indicate that there was some subjective  improvement in their opinion but they grant that they are biased and are hoping for something given his significant recent decline.    Physical Examination   VITAL SIGNS:   vitals were not taken for this visit.    Deferred secondary to video consultation     Neurologic Exam Deferred secondary to video consultation     Review of Systems   Review of Systems  Constitutional: negative for fever or chills.  HENT: Negative for tinnitus or rhinorrhea   Eyes: Negative for visual disturbance.   Musculoskeletal: Negative for gait problem. Negative for neck pain. Negative for back pain.   Skin: Negative for wounds.  Neurological: no history of seizures.  Respiratory: Negative for cough or wheezing  Endocrine: Negative for cold or heat intolerance   GI: Negative for constipation or diarrhea     Radiology Interpretation   None new to review     Impression   79 y.o. 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    Plan   1. Family to decide on next steps understanding that  the LP trial showed little (MOCA 2 points) objective improvement but to their eyes they saw some subjective improvement that was more, and that we are at a somewhat desperate point  2. Please call with questions in the interim    Follow-up   2 weeks    Levie Heritage, MD     The note above was prepped by Luciana Axe, NP who did not participate in the care of this patient at the time of the office encounter.

## 2019-07-29 ENCOUNTER — Encounter (INDEPENDENT_AMBULATORY_CARE_PROVIDER_SITE_OTHER): Payer: Self-pay | Admitting: Nurse Practitioner

## 2019-07-31 ENCOUNTER — Encounter (INDEPENDENT_AMBULATORY_CARE_PROVIDER_SITE_OTHER): Payer: Self-pay | Admitting: Neurological Surgery

## 2019-07-31 ENCOUNTER — Telehealth (INDEPENDENT_AMBULATORY_CARE_PROVIDER_SITE_OTHER): Payer: Medicare Other | Admitting: Neurological Surgery

## 2019-07-31 DIAGNOSIS — G9389 Other specified disorders of brain: Secondary | ICD-10-CM

## 2019-08-12 ENCOUNTER — Encounter (INDEPENDENT_AMBULATORY_CARE_PROVIDER_SITE_OTHER): Payer: Self-pay | Admitting: Nurse Practitioner

## 2019-08-21 ENCOUNTER — Telehealth (INDEPENDENT_AMBULATORY_CARE_PROVIDER_SITE_OTHER): Payer: Medicare Other | Admitting: Neurological Surgery

## 2021-04-10 ENCOUNTER — Emergency Department: Payer: Medicare Other

## 2021-04-10 ENCOUNTER — Emergency Department
Admission: EM | Admit: 2021-04-10 | Discharge: 2021-04-10 | Disposition: A | Discharge status: Home / Self Care | Payer: Medicare Other | Attending: Emergency Medical Services | Admitting: Emergency Medical Services

## 2021-04-10 DIAGNOSIS — F0390 Unspecified dementia without behavioral disturbance: Secondary | ICD-10-CM | POA: Insufficient documentation

## 2021-04-10 DIAGNOSIS — W01198A Fall on same level from slipping, tripping and stumbling with subsequent striking against other object, initial encounter: Secondary | ICD-10-CM | POA: Insufficient documentation

## 2021-04-10 DIAGNOSIS — S0101XA Laceration without foreign body of scalp, initial encounter: Secondary | ICD-10-CM

## 2021-04-10 DIAGNOSIS — Y92198 Other place in other specified residential institution as the place of occurrence of the external cause: Secondary | ICD-10-CM | POA: Insufficient documentation

## 2021-04-10 DIAGNOSIS — Y9301 Activity, walking, marching and hiking: Secondary | ICD-10-CM | POA: Insufficient documentation

## 2021-04-10 DIAGNOSIS — S3210XA Unspecified fracture of sacrum, initial encounter for closed fracture: Secondary | ICD-10-CM | POA: Insufficient documentation

## 2021-04-10 DIAGNOSIS — R001 Bradycardia, unspecified: Secondary | ICD-10-CM

## 2021-04-10 DIAGNOSIS — W19XXXA Unspecified fall, initial encounter: Secondary | ICD-10-CM

## 2021-04-10 LAB — COMPREHENSIVE METABOLIC PANEL
ALT: 15 U/L (ref 0–55)
AST (SGOT): 19 U/L (ref 5–41)
Albumin/Globulin Ratio: 1.5 (ref 0.9–2.2)
Albumin: 3.4 g/dL — ABNORMAL LOW (ref 3.5–5.0)
Alkaline Phosphatase: 120 U/L — ABNORMAL HIGH (ref 37–117)
Anion Gap: 8 (ref 5.0–15.0)
BUN: 18 mg/dL (ref 9.0–28.0)
Bilirubin, Total: 0.6 mg/dL (ref 0.2–1.2)
CO2: 26 mEq/L (ref 17–29)
Calcium: 8.9 mg/dL (ref 7.9–10.2)
Chloride: 106 mEq/L (ref 99–111)
Creatinine: 0.9 mg/dL (ref 0.5–1.5)
Globulin: 2.2 g/dL (ref 2.0–3.6)
Glucose: 105 mg/dL — ABNORMAL HIGH (ref 70–100)
Potassium: 3.8 mEq/L (ref 3.5–5.3)
Protein, Total: 5.6 g/dL — ABNORMAL LOW (ref 6.0–8.3)
Sodium: 140 mEq/L (ref 135–145)

## 2021-04-10 LAB — CBC
Absolute NRBC: 0 10*3/uL (ref 0.00–0.00)
Hematocrit: 39.1 % (ref 37.6–49.6)
Hgb: 13.5 g/dL (ref 12.5–17.1)
MCH: 31.4 pg (ref 25.1–33.5)
MCHC: 34.5 g/dL (ref 31.5–35.8)
MCV: 90.9 fL (ref 78.0–96.0)
MPV: 9.8 fL (ref 8.9–12.5)
Nucleated RBC: 0 /100 WBC (ref 0.0–0.0)
Platelets: 259 10*3/uL (ref 142–346)
RBC: 4.3 10*6/uL (ref 4.20–5.90)
RDW: 14 % (ref 11–15)
WBC: 6.04 10*3/uL (ref 3.10–9.50)

## 2021-04-10 LAB — GFR: EGFR: >60

## 2021-04-10 LAB — TROPONIN I: Troponin I: 0.01 ng/mL (ref 0.00–0.05)

## 2021-04-10 LAB — LIPASE: Lipase: 25 U/L (ref 8–78)

## 2021-04-10 MED ORDER — MIDAZOLAM HCL 1 MG/ML IJ SOLN (WRAP)
2.50 mg | Freq: Once | INTRAMUSCULAR | Status: DC
Start: 2021-04-10 — End: 2021-04-10
  Filled 2021-04-10: qty 4

## 2021-04-10 NOTE — ED Notes (Signed)
Bed: A11  Expected date:   Expected time:   Means of arrival:   Comments:  440

## 2021-04-10 NOTE — Discharge Instructions (Signed)
Monitor symptoms for any worsening. Followup with your family doctor as discussed. Take any medications prescribed to you as directed. Return for any other concerns or for worsening. Please read all patient education sheets.     Monitor symptoms, limit activities, follow-up outpatient return for other concerns or worsening              You were seen today by Nettie Elm, PA-C. Thank you for choosing the [facility] for your healthcare needs. We hope your visit today was EXCELLENT.    Follow up with your doctor tomorrow if any symptoms persists.   Please take any medications prescribed as directed.   If you have any questions or concerns, I am available at [phone]. Please do not hesitate to contact me if I can be of assistance.   Below is some information and resources that our patients often find helpful.   Sincerely,   [facility] S[facility] Department of Emergency Medicine   ________________________________________________________________   Thank you for choosing [facility] for your emergency care needs. We strive to provide EXCELLENT care to you and your family.   IF YOU DO NOT CONTINUE TO IMPROVE OR YOUR CONDITION WORSENS, PLEASE CONTACT YOUR DOCTOR OR RETURN IMMEDIATELY TO THE EMERGENCY DEPARTMENT.   DOCTOR REFERRALS   Call [phone] (available 24 hours a day, 7 days a week) if you need any further referrals and we can help you find a primary care doctor or specialist. Also, available online at: [email or web address]   YOUR CONTACT INFORMATION   Before leaving please check with registration to make sure we have an up-to-date contact number. You can call registration at [phone] to update your information. For questions about your hospital bill, please call [phone]. For questions about your Emergency Dept Physician bill please call [phone].   FREE HEALTH SERVICES   If you need help with health or social services, please call  2-1-1 for a free referral to resources in your area. 2-1-1 is a free service connecting people with information on health insurance, free clinics, pregnancy, mental health, dental care, food assistance, housing, and substance abuse counseling. Also, available online at: http://www.[facility].org   MEDICAL RECORDS AND TESTS   Certain laboratory test results do not come back the same day, for example urine cultures. We will contact you if other important findings are noted. Radiology films are often reviewed again to ensure accuracy. If there is any discrepancy, we will notify you.   Please call [phone] to pick up a complimentary CD of any radiology studies performed. If you or your doctor would like to request a copy of your medical records, please call [phone].   ORTHOPEDIC INJURY   Please know that significant injuries can exist even when an initial x-ray is read as normal or negative. This can occur because some fractures (broken bones) are not initially visible on x-rays. For this reason, close outpatient follow-up with your primary care doctor or bone specialist (orthopedist) is required.   MEDICATIONS AND FOLLOWUP   Please be aware that some prescription medications can cause drowsiness. Use caution when driving or operating machinery.   The examination and treatment you have received in our Emergency Department is provided on an emergency basis, and is not intended to be a substitute for your primary care physician. It is important that your doctor checks you again and that you report any new or remaining problems at that time.   24  HOUR PHARMACIES  [facility] - [address] [place], [place] [address] (1.4 miles, 7 minutes)   [facility] - [address] [place], [place] [address] (6.5 miles, 13 minutes)   Handout with directions available on request.

## 2021-04-10 NOTE — ED Triage Notes (Signed)
Pt BIBA for head laceration s/p fall from standing and hit the corner of a wall. No LOC. NH pt.

## 2021-04-10 NOTE — ED Notes (Signed)
Per CT, pt is not letting them get scan done. Pt is not laying down and attempting to get off the table. Dola Argyle., PA notified.

## 2021-04-10 NOTE — ED Provider Notes (Signed)
History     Chief Complaint   Patient presents with    Laceration    Fall     Danny Sutton is a 80 y.o. male with history of Alzheimer's dementia, hypertension, prostate cancer, here for evaluation of ground-level fall with associated head injury and scalp laceration.  Patient was walking with a member at a memory care unit when he fell and hit the corner of a wall.  No apparent LOC.  Staff states that patient is currently at baseline with no other acute concerns  Patient here for evaluation.  Spoke with the [facility] at [facility] care unity- stated he was in a standing position and nurse aide was trying to help going to the bathroom, fell, hit head on the door hinge. No LOC.   Also discussed patient presentation with daughter who was in the ER states that he is currently at baseline       Past Medical History:   Diagnosis Date    Alzheimer disease     Dementia     Heart murmur     Hypertension     Prostate cancer     Prostate cancer     Prostate cancer        Past Surgical History:   Procedure Laterality Date    PROSTATE SURGERY         Family History   Problem Relation Age of Onset    No known problems Mother        Social  Social History     Tobacco Use    Smoking status: Never    Smokeless tobacco: Never   Vaping Use    Vaping Use: Never used   Substance Use Topics    Alcohol use: No    Drug use: No       .     No Known Allergies    Home Medications       Med List Status: In Progress Set By: Kathaleen Grinder, RN at 04/10/2021  9:04 AM              carbidopa-levodopa (SINEMET) 25-100 MG per tablet     Take 1.5 tablets by mouth 3 (three) times daily     Cyanocobalamin (Vitamin B-12) 2500 MCG SL Tab     Place 2,500 mcg under the tongue        nystatin (NYSTOP) powder     Apply topically as needed Groin     petrolatum (AQUAPHOR) ointment     Apply topically as needed     senna (SENOKOT) 8.6 MG tablet     Take 1 tablet by mouth daily as needed for Constipation     sertraline (ZOLOFT) 50 MG tablet     Take 100 mg by mouth  daily             Review of Systems   Unable to perform ROS: Dementia     Physical Exam    BP: (!) 78/50, Heart Rate: (!) 51, Temp: 97.7 F (36.5 C), Resp Rate: 16, SpO2: 95 %, Weight: 70.6 kg    Physical Exam  Vitals and nursing note reviewed.   Constitutional:       General: He is not in acute distress.     Comments: Limited due to dementia   HENT:      Head: Normocephalic.        Right Ear: Ear canal normal.      Left Ear: Ear canal normal.  Nose: Nose normal. No congestion.   Eyes:      Extraocular Movements: Extraocular movements intact.      Pupils: Pupils are equal, round, and reactive to light.   Neck:      Comments: No apparent neck tenderness to palpation  Cardiovascular:      Rate and Rhythm: Bradycardia present.   Pulmonary:      Effort: Pulmonary effort is normal. No respiratory distress.      Breath sounds: Normal breath sounds.   Abdominal:      General: There is no distension.      Tenderness: There is no abdominal tenderness. There is no guarding.   Musculoskeletal:      Comments: No apparent tenderness to palpation to the pelvis or hips         MDM and ED Course     ED Medication Orders (From admission, onward)      Start Ordered     Status Ordering Provider    04/10/21 1004 04/10/21 1003  midazolam (VERSED) injection 2.5 mg  Once        Route: Intravenous  Ordered Dose: 2.5 mg     Last MAR action: Not Given Bell Cai, [place]               MDM  Number of Diagnoses or Management Options  Diagnosis management comments: Patient here for evaluation of fall while at memory care unit prior to arrival.  Patient was with a nurses aide walking to the bathroom when he lost balance and fell hitting his head on either the corner or a door handle.   Bleeding to the scalp.  Patient is at baseline noted from the memory center as well as the daughter who was in the ER.  On bedside exam patient had no apparent pelvic or hip tenderness to palpation no extremity tenderness to palpation no apparent neck tenderness to  palpation.  Review of systems was limited due to patient's Alzheimer's dementia.  Labs show no acute concerns, EKG shows no acute concerns, troponin negative  Imaging shows no acute findings aside from transverse S4 fracture.  Discussed with orthopedics recommendation for patient to sit on a donut as needed to help with symptoms  No other IC concerns no bleed or fracture  Scalp laceration was repaired with 3 staples  Discussed at length with daughter who was in the ER would like to try to take him back to the [facility] herself.                     Lac Repair    Date/Time: 04/10/2021 12:35 PM  Performed by: Nettie Elm, PA  Authorized by: Terance Ice Alexander-Nicholas D, MD     Consent:     Consent obtained:  Verbal    Consent given by:  Patient and guardian    Risks discussed:  Pain    Alternatives discussed:  No treatment  Universal protocol:     Patient identity confirmed:  Verbally with patient and arm band  Anesthesia:     Anesthesia method:  None  Laceration details:     Location:  Scalp    Scalp location:  L parietal    Length (cm):  4    Depth (mm):  1  Treatment:     Area cleansed with:  Saline and Shur-Clens    Amount of cleaning:  Standard    Irrigation solution:  Sterile saline    Debridement:  None    Undermining:  None  Skin repair:     Repair method:  Staples    Number of staples:  3  Approximation:     Approximation:  Close  Repair type:     Repair type:  Simple  Post-procedure details:     Dressing:  Open (no dressing)    Procedure completion:  Tolerated well, no immediate complications    Clinical Impression & Disposition     Clinical Impression  Final diagnoses:   Fall, initial encounter   Scalp laceration, initial encounter   Closed fracture of sacrum, unspecified portion of sacrum, initial encounter   Dementia, unspecified dementia severity, unspecified dementia type, unspecified whether behavioral, psychotic, or mood disturbance or anxiety        ED Disposition       ED Disposition   Discharge     Condition   --    Date/Time   Sun Apr 10, 2021 12:40 PM    Comment   Teressa Lower discharge to home/self care.    Condition at disposition: Stable                  New Prescriptions    No medications on file                   Drayce, [place], [place]  04/10/21 1240       Sutingco, Alexander-Nicholas D, MD  04/10/21 1252

## 2021-04-11 LAB — ECG 12-LEAD
Atrial Rate: 51 {beats}/min
P Axis: 74 degrees
P-R Interval: 164 ms
Q-T Interval: 440 ms
QRS Duration: 86 ms
QTC Calculation (Bezet): 405 ms
R Axis: 54 degrees
T Axis: 56 degrees
Ventricular Rate: 51 {beats}/min

## 2021-04-22 ENCOUNTER — Emergency Department: Payer: Medicare Other

## 2021-04-22 ENCOUNTER — Inpatient Hospital Stay: Payer: Medicare Other | Admitting: Certified Registered"

## 2021-04-22 ENCOUNTER — Encounter
Admission: EM | Disposition: A | Discharge status: Skilled Nursing Facility | Payer: Self-pay | Source: Home / Self Care | Attending: Hospitalist

## 2021-04-22 ENCOUNTER — Inpatient Hospital Stay
Admission: EM | Admit: 2021-04-22 | Discharge: 2021-04-26 | DRG: 481 | Disposition: A | Discharge status: Skilled Nursing Facility | Payer: Medicare Other | Attending: Hospitalist | Admitting: Hospitalist

## 2021-04-22 ENCOUNTER — Inpatient Hospital Stay: Payer: Medicare Other

## 2021-04-22 DIAGNOSIS — I10 Essential (primary) hypertension: Secondary | ICD-10-CM | POA: Diagnosis present

## 2021-04-22 DIAGNOSIS — F02C0 Dementia in other diseases classified elsewhere, severe, without behavioral disturbance, psychotic disturbance, mood disturbance, and anxiety: Secondary | ICD-10-CM | POA: Diagnosis present

## 2021-04-22 DIAGNOSIS — G20 Parkinson's disease: Secondary | ICD-10-CM | POA: Diagnosis present

## 2021-04-22 DIAGNOSIS — E440 Moderate protein-calorie malnutrition: Secondary | ICD-10-CM | POA: Diagnosis present

## 2021-04-22 DIAGNOSIS — Z79899 Other long term (current) drug therapy: Secondary | ICD-10-CM

## 2021-04-22 DIAGNOSIS — R079 Chest pain, unspecified: Secondary | ICD-10-CM

## 2021-04-22 DIAGNOSIS — S41112A Laceration without foreign body of left upper arm, initial encounter: Secondary | ICD-10-CM | POA: Diagnosis present

## 2021-04-22 DIAGNOSIS — Z23 Encounter for immunization: Secondary | ICD-10-CM

## 2021-04-22 DIAGNOSIS — W1830XA Fall on same level, unspecified, initial encounter: Secondary | ICD-10-CM | POA: Diagnosis present

## 2021-04-22 DIAGNOSIS — W19XXXA Unspecified fall, initial encounter: Secondary | ICD-10-CM

## 2021-04-22 DIAGNOSIS — Z20822 Contact with and (suspected) exposure to covid-19: Secondary | ICD-10-CM | POA: Diagnosis present

## 2021-04-22 DIAGNOSIS — Z66 Do not resuscitate: Secondary | ICD-10-CM | POA: Diagnosis present

## 2021-04-22 DIAGNOSIS — Z8546 Personal history of malignant neoplasm of prostate: Secondary | ICD-10-CM

## 2021-04-22 DIAGNOSIS — S72142A Displaced intertrochanteric fracture of left femur, initial encounter for closed fracture: Principal | ICD-10-CM | POA: Diagnosis present

## 2021-04-22 DIAGNOSIS — M21852 Other specified acquired deformities of left thigh: Secondary | ICD-10-CM | POA: Diagnosis present

## 2021-04-22 DIAGNOSIS — N3090 Cystitis, unspecified without hematuria: Secondary | ICD-10-CM

## 2021-04-22 DIAGNOSIS — F03C18 Unspecified dementia, severe, with other behavioral disturbance: Secondary | ICD-10-CM

## 2021-04-22 DIAGNOSIS — R8271 Bacteriuria: Secondary | ICD-10-CM | POA: Diagnosis present

## 2021-04-22 DIAGNOSIS — D62 Acute posthemorrhagic anemia: Secondary | ICD-10-CM | POA: Diagnosis not present

## 2021-04-22 HISTORY — PX: OPEN REDUCTION, HIP, INSERTION IM NAIL (GAMMA): SHX4858

## 2021-04-22 LAB — URINALYSIS REFLEX TO MICROSCOPIC EXAM - REFLEX TO CULTURE
Bilirubin, UA: NEGATIVE
Blood, UA: NEGATIVE
Glucose, UA: NEGATIVE
Ketones UA: NEGATIVE
Nitrite, UA: NEGATIVE
Protein, UR: NEGATIVE
Specific Gravity UA: 1.02 (ref 1.001–1.035)
Urine pH: 6 (ref 5.0–8.0)
Urobilinogen, UA: NORMAL mg/dL (ref 0.2–2.0)

## 2021-04-22 LAB — CBC AND DIFFERENTIAL
Absolute NRBC: 0 10*3/uL (ref 0.00–0.00)
Basophils Absolute Automated: 0.05 10*3/uL (ref 0.00–0.08)
Basophils Automated: 0.8 %
Eosinophils Absolute Automated: 0.07 10*3/uL (ref 0.00–0.44)
Eosinophils Automated: 1.2 %
Hematocrit: 39.2 % (ref 37.6–49.6)
Hgb: 13.5 g/dL (ref 12.5–17.1)
Immature Granulocytes Absolute: 0.02 10*3/uL (ref 0.00–0.07)
Immature Granulocytes: 0.3 %
Lymphocytes Absolute Automated: 1.67 10*3/uL (ref 0.42–3.22)
Lymphocytes Automated: 27.8 %
MCH: 31 pg (ref 25.1–33.5)
MCHC: 34.4 g/dL (ref 31.5–35.8)
MCV: 90.1 fL (ref 78.0–96.0)
MPV: 9.8 fL (ref 8.9–12.5)
Monocytes Absolute Automated: 0.58 10*3/uL (ref 0.21–0.85)
Monocytes: 9.7 %
Neutrophils Absolute: 3.62 10*3/uL (ref 1.10–6.33)
Neutrophils: 60.2 %
Nucleated RBC: 0 /100 WBC (ref 0.0–0.0)
Platelets: 283 10*3/uL (ref 142–346)
RBC: 4.35 10*6/uL (ref 4.20–5.90)
RDW: 14 % (ref 11–15)
WBC: 6.01 10*3/uL (ref 3.10–9.50)

## 2021-04-22 LAB — BASIC METABOLIC PANEL
Anion Gap: 11 (ref 5.0–15.0)
BUN: 17 mg/dL (ref 9.0–28.0)
CO2: 20 mEq/L (ref 17–29)
Calcium: 9.2 mg/dL (ref 7.9–10.2)
Chloride: 107 mEq/L (ref 99–111)
Creatinine: 0.9 mg/dL (ref 0.5–1.5)
Glucose: 118 mg/dL — ABNORMAL HIGH (ref 70–100)
Potassium: 3.9 mEq/L (ref 3.5–5.3)
Sodium: 138 mEq/L (ref 135–145)

## 2021-04-22 LAB — ABO/RH: ABO Rh: O POS

## 2021-04-22 LAB — PT/INR
PT INR: 1 (ref 0.9–1.1)
PT: 12 s (ref 10.1–12.9)

## 2021-04-22 LAB — COVID-19 (SARS-COV-2): SARS CoV 2 Overall Result: NOT DETECTED

## 2021-04-22 LAB — TYPE AND SCREEN
AB Screen Gel: NEGATIVE
ABO Rh: O POS

## 2021-04-22 LAB — GFR: EGFR: >60

## 2021-04-22 LAB — IHS 2ND ABORH REQUEST

## 2021-04-22 SURGERY — OPEN REDUCTION, HIP, INSERTION INTRAMEDULARY IMPLANT
Anesthesia: Anesthesia General | Site: Hip | Laterality: Left | Wound class: Clean

## 2021-04-22 MED ORDER — NALOXONE HCL 0.4 MG/ML IJ SOLN (WRAP)
0.4000 mg | INTRAMUSCULAR | Status: DC | PRN
Start: 2021-04-22 — End: 2021-04-26

## 2021-04-22 MED ORDER — SENNA 8.6 MG PO TABS
8.6000 mg | ORAL_TABLET | Freq: Every day | ORAL | Status: DC | PRN
Start: 2021-04-22 — End: 2021-04-26

## 2021-04-22 MED ORDER — DEXAMETHASONE SODIUM PHOSPHATE 4 MG/ML IJ SOLN (WRAP)
INTRAMUSCULAR | Status: DC | PRN
Start: 2021-04-22 — End: 2021-04-22
  Administered 2021-04-22: 4 mg via INTRAVENOUS

## 2021-04-22 MED ORDER — HEPARIN SODIUM (PORCINE) 5000 UNIT/ML IJ SOLN
5000.0000 [IU] | Freq: Two times a day (BID) | INTRAMUSCULAR | Status: DC
Start: 2021-04-22 — End: 2021-04-26
  Administered 2021-04-23 – 2021-04-26 (×7): 5000 [IU] via SUBCUTANEOUS
  Filled 2021-04-22 (×7): qty 1

## 2021-04-22 MED ORDER — SODIUM CHLORIDE 0.9% BAG (IRRIGATION USE)
INTRAVENOUS | Status: DC | PRN
Start: 2021-04-22 — End: 2021-04-22
  Administered 2021-04-22: 1000 mL

## 2021-04-22 MED ORDER — LACTATED RINGERS IV SOLN
INTRAVENOUS | Status: DC | PRN
Start: 2021-04-22 — End: 2021-04-22

## 2021-04-22 MED ORDER — ACETAMINOPHEN 325 MG PO TABS
650.0000 mg | ORAL_TABLET | Freq: Once | ORAL | Status: DC | PRN
Start: 2021-04-22 — End: 2021-04-22

## 2021-04-22 MED ORDER — DEXTROSE-SODIUM CHLORIDE 5-0.45 % IV SOLN
INTRAVENOUS | Status: DC
Start: 2021-04-22 — End: 2021-04-22

## 2021-04-22 MED ORDER — CALCIUM CITRATE-VITAMIN D 315-250 MG-UNIT PO TABS
2.0000 | ORAL_TABLET | Freq: Every day | ORAL | Status: DC
Start: 2021-04-23 — End: 2021-04-26
  Administered 2021-04-23 – 2021-04-26 (×4): 2 via ORAL
  Filled 2021-04-22 (×4): qty 2

## 2021-04-22 MED ORDER — SERTRALINE HCL 50 MG PO TABS
100.0000 mg | ORAL_TABLET | Freq: Every day | ORAL | Status: DC
Start: 2021-04-23 — End: 2021-04-26
  Administered 2021-04-23 – 2021-04-26 (×4): 100 mg via ORAL
  Filled 2021-04-22 (×4): qty 2

## 2021-04-22 MED ORDER — ONDANSETRON HCL 4 MG/2ML IJ SOLN
4.0000 mg | INTRAMUSCULAR | Status: DC | PRN
Start: 2021-04-22 — End: 2021-04-22

## 2021-04-22 MED ORDER — MORPHINE SULFATE 2 MG/ML IJ/IV SOLN (WRAP)
2.0000 mg | Status: AC | PRN
Start: 2021-04-22 — End: 2021-04-24
  Administered 2021-04-22 – 2021-04-23 (×2): 2 mg via INTRAVENOUS
  Filled 2021-04-22 (×2): qty 1

## 2021-04-22 MED ORDER — CEFAZOLIN SODIUM 1 G IJ SOLR
2.0000 g | Freq: Once | INTRAMUSCULAR | Status: AC
Start: 2021-04-22 — End: 2021-04-22
  Administered 2021-04-22: 18:00:00 2 g via INTRAVENOUS

## 2021-04-22 MED ORDER — ONDANSETRON HCL 4 MG/2ML IJ SOLN
INTRAMUSCULAR | Status: DC | PRN
Start: 2021-04-22 — End: 2021-04-22
  Administered 2021-04-22: 4 mg via INTRAVENOUS

## 2021-04-22 MED ORDER — CARBIDOPA-LEVODOPA 25-100 MG PO TABS
1.5000 | ORAL_TABLET | Freq: Three times a day (TID) | ORAL | Status: DC
Start: 2021-04-22 — End: 2021-04-24
  Administered 2021-04-23 – 2021-04-24 (×4): 1.5 via ORAL
  Filled 2021-04-22 (×8): qty 2

## 2021-04-22 MED ORDER — FENTANYL CITRATE (PF) 50 MCG/ML IJ SOLN (WRAP)
25.0000 ug | INTRAMUSCULAR | Status: DC | PRN
Start: 2021-04-22 — End: 2021-04-22
  Administered 2021-04-22 (×3): 25 ug via INTRAVENOUS
  Filled 2021-04-22: qty 2

## 2021-04-22 MED ORDER — SODIUM CHLORIDE 0.9 % IV MBP
1.0000 g | Freq: Three times a day (TID) | INTRAVENOUS | Status: AC
Start: 2021-04-23 — End: 2021-04-23
  Administered 2021-04-23 (×2): 1 g via INTRAVENOUS
  Filled 2021-04-22 (×2): qty 1000

## 2021-04-22 MED ORDER — SENNOSIDES-DOCUSATE SODIUM 8.6-50 MG PO TABS
2.0000 | ORAL_TABLET | Freq: Two times a day (BID) | ORAL | Status: DC
Start: 2021-04-22 — End: 2021-04-26
  Administered 2021-04-23 – 2021-04-26 (×8): 2 via ORAL
  Filled 2021-04-22 (×8): qty 2

## 2021-04-22 MED ORDER — FENTANYL CITRATE (PF) 50 MCG/ML IJ SOLN (WRAP)
INTRAMUSCULAR | Status: AC
Start: 2021-04-22 — End: ?
  Filled 2021-04-22: qty 2

## 2021-04-22 MED ORDER — FENTANYL CITRATE (PF) 50 MCG/ML IJ SOLN (WRAP)
INTRAMUSCULAR | Status: DC | PRN
Start: 2021-04-22 — End: 2021-04-22
  Administered 2021-04-22: 100 ug via INTRAVENOUS
  Administered 2021-04-22 (×2): 50 ug via INTRAVENOUS

## 2021-04-22 MED ORDER — LIDOCAINE HCL (PF) 2 % IJ SOLN
INTRAMUSCULAR | Status: AC
Start: 2021-04-22 — End: ?
  Filled 2021-04-22: qty 2

## 2021-04-22 MED ORDER — ROCURONIUM BROMIDE 50 MG/5ML IV SOLN
INTRAVENOUS | Status: DC | PRN
Start: 2021-04-22 — End: 2021-04-22
  Administered 2021-04-22: 20 mg via INTRAVENOUS
  Administered 2021-04-22: 10 mg via INTRAVENOUS

## 2021-04-22 MED ORDER — HYDROMORPHONE HCL 2 MG PO TABS
2.0000 mg | ORAL_TABLET | Freq: Once | ORAL | Status: DC | PRN
Start: 2021-04-22 — End: 2021-04-22

## 2021-04-22 MED ORDER — CEFAZOLIN SODIUM 1 G IJ SOLR
INTRAMUSCULAR | Status: AC
Start: 2021-04-22 — End: ?
  Filled 2021-04-22: qty 2000

## 2021-04-22 MED ORDER — VITAMINS/MINERALS PO TABS
1.0000 | ORAL_TABLET | Freq: Every day | ORAL | Status: DC
Start: 2021-04-23 — End: 2021-04-26
  Administered 2021-04-23 – 2021-04-26 (×4): 1 via ORAL
  Filled 2021-04-22 (×4): qty 1

## 2021-04-22 MED ORDER — BISACODYL 10 MG RE SUPP
10.0000 mg | Freq: Every day | RECTAL | Status: DC | PRN
Start: 2021-04-22 — End: 2021-04-26

## 2021-04-22 MED ORDER — PROMETHAZINE HCL 25 MG/ML IJ SOLN
6.2500 mg | Freq: Once | INTRAMUSCULAR | Status: DC | PRN
Start: 2021-04-22 — End: 2021-04-22

## 2021-04-22 MED ORDER — ACETAMINOPHEN 500 MG PO TABS
1000.0000 mg | ORAL_TABLET | Freq: Three times a day (TID) | ORAL | Status: DC
Start: 2021-04-22 — End: 2021-04-26
  Administered 2021-04-23 – 2021-04-26 (×10): 1000 mg via ORAL
  Filled 2021-04-22 (×14): qty 2

## 2021-04-22 MED ORDER — ROPIVACAINE HCL 5 MG/ML IJ SOLN
INTRAMUSCULAR | Status: DC | PRN
Start: 2021-04-22 — End: 2021-04-22
  Administered 2021-04-22: 10 mL

## 2021-04-22 MED ORDER — OXYCODONE-ACETAMINOPHEN 5-325 MG PO TABS
1.0000 | ORAL_TABLET | Freq: Once | ORAL | Status: DC | PRN
Start: 2021-04-22 — End: 2021-04-22

## 2021-04-22 MED ORDER — DEXTROSE 10 % IV BOLUS
25.0000 g | INTRAVENOUS | Status: DC | PRN
Start: 2021-04-22 — End: 2021-04-26

## 2021-04-22 MED ORDER — ONDANSETRON HCL 4 MG/2ML IJ SOLN
4.0000 mg | Freq: Once | INTRAMUSCULAR | Status: AC
Start: 2021-04-22 — End: 2021-04-22
  Administered 2021-04-22: 08:00:00 4 mg via INTRAVENOUS
  Filled 2021-04-22: qty 2

## 2021-04-22 MED ORDER — SUCCINYLCHOLINE CHLORIDE 20 MG/ML IJ SOLN
INTRAMUSCULAR | Status: DC | PRN
Start: 2021-04-22 — End: 2021-04-22
  Administered 2021-04-22: 100 mg via INTRAVENOUS

## 2021-04-22 MED ORDER — DEXTROSE 50 % IV SOLN
25.0000 g | INTRAVENOUS | Status: DC | PRN
Start: 2021-04-22 — End: 2021-04-26

## 2021-04-22 MED ORDER — MEPERIDINE HCL 25 MG/ML IJ SOLN
25.0000 mg | Freq: Once | INTRAMUSCULAR | Status: DC
Start: 2021-04-22 — End: 2021-04-22

## 2021-04-22 MED ORDER — OXYCODONE HCL 10 MG PO TABS
10.0000 mg | ORAL_TABLET | ORAL | Status: DC | PRN
Start: 2021-04-22 — End: 2021-04-26
  Administered 2021-04-24 – 2021-04-25 (×3): 10 mg via ORAL
  Filled 2021-04-22 (×4): qty 1

## 2021-04-22 MED ORDER — OXYCODONE HCL 5 MG PO TABS
5.0000 mg | ORAL_TABLET | ORAL | Status: DC | PRN
Start: 2021-04-22 — End: 2021-04-26
  Administered 2021-04-23 – 2021-04-26 (×4): 5 mg via ORAL
  Filled 2021-04-22 (×4): qty 1

## 2021-04-22 MED ORDER — LACTATED RINGERS IV SOLN
75.0000 mL/h | INTRAVENOUS | Status: DC
Start: 2021-04-22 — End: 2021-04-22

## 2021-04-22 MED ORDER — TRANEXAMIC ACID-NACL 1000-0.7 MG/100ML-% IV SOLN
INTRAVENOUS | Status: AC
Start: 2021-04-22 — End: ?
  Filled 2021-04-22: qty 200

## 2021-04-22 MED ORDER — HYDROMORPHONE HCL 1 MG/ML IJ SOLN
INTRAMUSCULAR | Status: DC | PRN
Start: 2021-04-22 — End: 2021-04-22
  Administered 2021-04-22 (×2): .5 mg via INTRAVENOUS

## 2021-04-22 MED ORDER — ROCURONIUM BROMIDE 50 MG/5ML IV SOLN
INTRAVENOUS | Status: AC
Start: 2021-04-22 — End: ?
  Filled 2021-04-22: qty 5

## 2021-04-22 MED ORDER — GLUCAGON 1 MG IJ SOLR (WRAP)
1.0000 mg | INTRAMUSCULAR | Status: DC | PRN
Start: 2021-04-22 — End: 2021-04-26

## 2021-04-22 MED ORDER — ROPIVACAINE HCL 5 MG/ML IJ SOLN
INTRAMUSCULAR | Status: AC
Start: 2021-04-22 — End: ?
  Filled 2021-04-22: qty 20

## 2021-04-22 MED ORDER — ONDANSETRON 4 MG PO TBDP
4.0000 mg | ORAL_TABLET | Freq: Three times a day (TID) | ORAL | Status: DC | PRN
Start: 2021-04-22 — End: 2021-04-26

## 2021-04-22 MED ORDER — TRANEXAMIC ACID-NACL 1000-0.7 MG/100ML-% IV SOLN
1000.0000 mg | Freq: Once | INTRAVENOUS | Status: AC
Start: 2021-04-22 — End: 2021-04-22
  Administered 2021-04-22: 18:00:00 1000 mg via INTRAVENOUS

## 2021-04-22 MED ORDER — NYSTATIN 100000 UNIT/GM EX POWD
CUTANEOUS | Status: DC | PRN
Start: 2021-04-22 — End: 2021-04-26

## 2021-04-22 MED ORDER — SODIUM CHLORIDE 0.9 % IV MBP
1.0000 g | INTRAVENOUS | Status: DC
Start: 2021-04-23 — End: 2021-04-24
  Administered 2021-04-23: 19:00:00 1 g via INTRAVENOUS
  Filled 2021-04-22 (×3): qty 1000

## 2021-04-22 MED ORDER — DEXAMETHASONE SODIUM PHOSPHATE 4 MG/ML IJ SOLN
INTRAMUSCULAR | Status: AC
Start: 2021-04-22 — End: ?
  Filled 2021-04-22: qty 1

## 2021-04-22 MED ORDER — DEXTROSE 5% IV BOLUS
250.0000 mL | INTRAVENOUS | Status: DC | PRN
Start: 2021-04-22 — End: 2021-04-26

## 2021-04-22 MED ORDER — HYDROXYZINE PAMOATE 25 MG PO CAPS
50.0000 mg | ORAL_CAPSULE | Freq: Four times a day (QID) | ORAL | Status: DC | PRN
Start: 2021-04-22 — End: 2021-04-26

## 2021-04-22 MED ORDER — ONDANSETRON HCL 4 MG/2ML IJ SOLN
4.0000 mg | Freq: Once | INTRAMUSCULAR | Status: DC | PRN
Start: 2021-04-22 — End: 2021-04-22

## 2021-04-22 MED ORDER — ONDANSETRON HCL 4 MG/2ML IJ SOLN
4.0000 mg | Freq: Three times a day (TID) | INTRAMUSCULAR | Status: DC | PRN
Start: 2021-04-22 — End: 2021-04-26

## 2021-04-22 MED ORDER — LIDOCAINE HCL 2 % IJ SOLN
INTRAMUSCULAR | Status: DC | PRN
Start: 2021-04-22 — End: 2021-04-22
  Administered 2021-04-22: 40 mg

## 2021-04-22 MED ORDER — HYDROMORPHONE HCL 0.5 MG/0.5 ML IJ SOLN
0.5000 mg | INTRAMUSCULAR | Status: DC | PRN
Start: 2021-04-22 — End: 2021-04-22

## 2021-04-22 MED ORDER — TETANUS-DIPHTH-ACELL PERTUSSIS 5-2.5-18.5 LF-MCG/0.5 IM SUSP
0.5000 mL | Freq: Once | INTRAMUSCULAR | Status: AC
Start: 2021-04-22 — End: 2021-04-22
  Administered 2021-04-22: 08:00:00 0.5 mL via INTRAMUSCULAR
  Filled 2021-04-22: qty 0.5

## 2021-04-22 MED ORDER — NALOXONE HCL 0.4 MG/ML IJ SOLN (WRAP)
0.2000 mg | INTRAMUSCULAR | Status: DC | PRN
Start: 2021-04-22 — End: 2021-04-26

## 2021-04-22 MED ORDER — MORPHINE SULFATE 4 MG/ML IJ/IV SOLN (WRAP)
4.0000 mg | Freq: Once | Status: AC
Start: 2021-04-22 — End: 2021-04-22
  Administered 2021-04-22: 10:00:00 4 mg via INTRAVENOUS
  Filled 2021-04-22: qty 1

## 2021-04-22 MED ORDER — MORPHINE SULFATE 4 MG/ML IJ/IV SOLN (WRAP)
4.0000 mg | Freq: Once | Status: AC
Start: 2021-04-22 — End: 2021-04-22
  Administered 2021-04-22: 08:00:00 4 mg via INTRAVENOUS
  Filled 2021-04-22: qty 1

## 2021-04-22 MED ORDER — AMMONIA AROMATIC IN INHA
1.0000 | Freq: Once | RESPIRATORY_TRACT | Status: DC | PRN
Start: 2021-04-22 — End: 2021-04-22

## 2021-04-22 MED ORDER — SUCCINYLCHOLINE CHLORIDE 20 MG/ML IJ SOLN
INTRAMUSCULAR | Status: AC
Start: 2021-04-22 — End: ?
  Filled 2021-04-22: qty 10

## 2021-04-22 MED ORDER — ONDANSETRON HCL 4 MG/2ML IJ SOLN
4.0000 mg | Freq: Four times a day (QID) | INTRAMUSCULAR | Status: AC | PRN
Start: 2021-04-22 — End: 2021-04-24

## 2021-04-22 MED ORDER — HYDROMORPHONE HCL 1 MG/ML IJ SOLN
INTRAMUSCULAR | Status: AC
Start: 2021-04-22 — End: ?
  Filled 2021-04-22: qty 1

## 2021-04-22 MED ORDER — MAGNESIUM HYDROXIDE 400 MG/5ML PO SUSP
10.0000 mL | Freq: Every day | ORAL | Status: DC | PRN
Start: 2021-04-22 — End: 2021-04-26

## 2021-04-22 MED ORDER — MORPHINE SULFATE 4 MG/ML IJ/IV SOLN (WRAP)
4.0000 mg | Status: DC | PRN
Start: 2021-04-22 — End: 2021-04-22

## 2021-04-22 MED ORDER — DEXTROSE-SODIUM CHLORIDE 5-0.45 % IV SOLN
INTRAVENOUS | Status: DC
Start: 2021-04-22 — End: 2021-04-24

## 2021-04-22 MED ORDER — PROPOFOL 10 MG/ML IV EMUL (WRAP)
INTRAVENOUS | Status: DC | PRN
Start: 2021-04-22 — End: 2021-04-22
  Administered 2021-04-22: 100 mg via INTRAVENOUS

## 2021-04-22 MED ORDER — CEFTRIAXONE SODIUM 1 G IJ SOLR
1.0000 g | Freq: Once | INTRAMUSCULAR | Status: AC
Start: 2021-04-22 — End: 2021-04-22
  Administered 2021-04-22: 10:00:00 1 g via INTRAVENOUS
  Filled 2021-04-22: qty 1000

## 2021-04-22 MED ORDER — PROPOFOL 10 MG/ML IV EMUL (WRAP)
INTRAVENOUS | Status: AC
Start: 2021-04-22 — End: ?
  Filled 2021-04-22: qty 20

## 2021-04-22 MED ORDER — MELATONIN 3 MG PO TABS
3.0000 mg | ORAL_TABLET | Freq: Every evening | ORAL | Status: DC | PRN
Start: 2021-04-22 — End: 2021-04-26

## 2021-04-22 MED ORDER — ONDANSETRON HCL 4 MG/2ML IJ SOLN
INTRAMUSCULAR | Status: AC
Start: 2021-04-22 — End: ?
  Filled 2021-04-22: qty 2

## 2021-04-22 SURGICAL SUPPLY — 73 items
ADHESIVE SKIN CLOSURE DERMABOND ADVANCED (Skin Closure)
ADHESIVE SKIN CLOSURE DERMABOND ADVANCED .7 ML LIQUID APPLICATOR (Skin Closure) IMPLANT
ADHESIVE SKNCLS 2 OCTYL CYNCRLT .7ML (Skin Closure)
APPLICATOR CHLORAPREP 26 ML 70% ISOPROPYL ALCOHOL 2% CHLORHEXIDINE (Applicator) ×2 IMPLANT
APPLICATOR PRP 70% ISPRP 2% CHG 26ML (Applicator) ×4
BANDAGE CMPR CFLX NL 5YDX4IN LF STRL (Procedure Accessories) ×6
BANDAGE COFLEX NL COMPRESSION L5 YD X W4 IN COHESIVE SOFT FOAM TAN (Procedure Accessories) ×3 IMPLANT
BIT DRILL L300 MM OD4.2 MM SMALL AO (Drillbits) ×1
BIT DRILL L300 MM OD4.2 MM SMALL GAMMA3 AO (Drillbits) ×1 IMPLANT
BIT DRL 4.2MM 300MM STRL QC DISP (Drillbits) ×1
COVER CAM LF STRL LEN DISP CLR (Procedure Accessories) ×2
COVER CAMERA LENS DISPOSABLE CLEAR STERILE LATEX FREE (Procedure Accessories) ×1 IMPLANT
DRAPE 74X41IN UNIVERSAL POLY XRAY C ARM CLOSURE STRAP (Drape) ×1 IMPLANT
DRAPE EQP C-ARMOR STRL XPD CLPSBL CARM (Drape) ×2
DRAPE EQP VLCR POLY UNV STRDRP 74X41IN (Drape) ×2
DRAPE EXPAND COLLAPSIBLE EQUIPMENT C-ARMOR C ARM FLUOROSCOPE STERILE (Drape) ×1 IMPLANT
DRAPE SRG PE STRDRP 17X11IN LF STRL ADH (Drape) ×2
DRAPE SRG PLS STRDRP IOBN 125X83IN STRL (Drape) ×1
DRAPE SRG PLS U STRDRP 51X47IN LF STRL (Drape) ×1
DRAPE SURGICAL 2 INCISE FILM POUCH (Drape) ×1
DRAPE SURGICAL 2 INCISE FILM POUCH ISOLATION ADHESIVE STRIP L125 IN X (Drape) ×1 IMPLANT
DRAPE SURGICAL ADHESIVE L51 IN X W47 IN (Drape) ×1
DRAPE SURGICAL ADHESIVE L51 IN X W47 IN STERI-DRAPE CLEAR (Drape) ×1 IMPLANT
DRAPE SURGICAL ADHESIVE STRIP SMALL TOWEL MATTE FINISH L17 IN X W11 IN (Drape) ×1 IMPLANT
DRESSING FM SLVR SLF MPLX BR 10X4IN PS (Dressing) ×1
DRESSING L10 IN X W4 IN MEPILEX BORDER (Dressing) ×1
DRESSING L10 IN X W4 IN MEPILEX BORDER FOAM SILVER SULPHATE (Dressing) ×1 IMPLANT
DRESSING TRANSPARENT L4 3/4 IN X W4 IN (Dressing) ×1
DRESSING TRANSPARENT L4 3/4 IN X W4 IN POLYURETHANE ADHESIVE (Dressing) ×1 IMPLANT
DRESSING TRNS PU STD TGDRM 4.75X4IN LF (Dressing) ×1
GLOVE SRG NTR RBR 8 BGL SRG LTX STRL PF (Glove) ×1
GLOVE SRG PLISPRN 8 BGL PI INDCTR (Glove) ×2
GLOVE SURGICAL 8 BIOGEL PI INDICATOR (Glove) ×2
GLOVE SURGICAL 8 BIOGEL PI INDICATOR UNDERGLOVE POWDER FREE SMOOTH (Glove) ×2 IMPLANT
GLOVE SURGICAL 8 BIOGEL SURGEONS POWDER (Glove) ×1
GLOVE SURGICAL 8 BIOGEL SURGEONS POWDER FREE BEAD CUFF TEXTURE SURFACE (Glove) ×1 IMPLANT
HANDLE LGHT ASPN LF STRL SNPON (Procedure Accessories) ×1
HANDLE LIGHT SNAP ON ASPEN (Procedure Accessories) ×1 IMPLANT
KIT INFECTION CONTROL CUSTOM (Kits) ×2
KIT INFECTION CONTROL CUSTOM IFOH03 (Kits) ×1 IMPLANT
KIT SRGBSN LF STRL 2 FOAK DISP (Procedure Accessories) ×1
KIT SURGICAL BASIN 2 FOAK (Procedure Accessories) ×1
KIT SURGICAL BASIN 2 FOAK MEDLINE INDUSTRIES, INC. (Procedure Accessories) ×1 IMPLANT
NAIL IM TI 125D GAMMA3 11MM 180MM LF (Nail) ×1 IMPLANT
NAIL OD11 MM L180 MM HIP TROCHANTER (Nail) ×1 IMPLANT
NAIL OD11 MM L180 MM HIP TROCHANTER TITANIUM GAMMA3 INTRAMEDULLARY KIT (Nail) ×1 IMPLANT
PAD ABD PVC CRTY 9X5IN LF STRL 3 LYR (Dressing) ×2 IMPLANT
PADDING CAST L4 YD X W6 IN UNDERCAST (Cast) ×3
PADDING CAST L4 YD X W6 IN UNDERCAST MILD STRETCH COHESIVE WEBRIL (Cast) ×3 IMPLANT
PADDING CST CTTN WBRL 4YDX6IN LF STRL (Cast) ×3
PENCIL SMKEVC LF COAT PSHBTN (Cautery) ×1
PENCIL SMOKE EVACUATOR COATED PUSH (Cautery) ×1
PENCIL SMOKE EVACUATOR COATED PUSH BUTTON NEPTUNE E-SEP (Cautery) ×1 IMPLANT
SCREW BN T2 ALPHA 5MM 37.5MM STRL LCK (Screw) ×1 IMPLANT
SCREW BN TI GAMMA3 10.5MM 110MM STRL LG (Screw) ×1 IMPLANT
SCREW L110 MM OD10.5 MM TITANIUM LAG (Screw) ×1 IMPLANT
SCREW L110 MM OD10.5 MM TITANIUM LAG TROCHANTER NAIL 180 BONE GAMMA3 (Screw) ×1 IMPLANT
SCREW L37.5 MM OD5 MM LOCK BONE T2 ALPHA (Screw) ×1 IMPLANT
SPONGE GAUZE L2 IN X W2 IN 8 PLY PEEL (Dressing) ×1 IMPLANT
SPONGE GZE CTTN CRTY 2X2IN LF STRL 8P (Dressing) ×1
STAPLER SKIN L4.1 MM X W6.5 MM 35 WIDE (Staplers) ×1
STAPLER SKIN L4.1 MM X W6.5 MM 35 WIDE STAPLE CARTRIDGE APPOSE ULC (Staplers) ×1 IMPLANT
STAPLER SKN SS PLS APS U 4.1X6.5MM LF 35 (Staplers) ×1
SUTURE ABS 0 CT1 VCL 27IN BRD COAT UD (Suture) ×1
SUTURE ABS 2-0 CT1 VCL 27IN BRD COAT UD (Suture) ×1
SUTURE COATED VICRYL 0 CT-1 L27 IN BRAID (Suture) ×1
SUTURE COATED VICRYL 0 CT-1 L27 IN BRAID COATED UNDYED ABSORBABLE (Suture) ×1 IMPLANT
SUTURE COATED VICRYL 2-0 CT-1 L27 IN (Suture) ×1
SUTURE COATED VICRYL 2-0 CT-1 L27 IN BRAID COATED UNDYED ABSORBABLE (Suture) ×1 IMPLANT
TRAY SRG MAJOR PROC IFOH (Pack) ×2
TRAY SURGICAL MAJOR (Pack) ×1 IMPLANT
WIRE FIXATION OD3.2 MM L450 MM KIRSCHNER (Guide Wire) ×3 IMPLANT
WIRE FX KRSH 3.2MM 450MM STRL (Guide Wire) ×3

## 2021-04-22 NOTE — Anesthesia Preprocedure Evaluation (Signed)
Anesthesia Evaluation    AIRWAY    Mallampati: II    TM distance: >3 FB  Neck ROM: full  Mouth Opening:full  Planned to use difficult airway equipment: No CARDIOVASCULAR    regular and normal       DENTAL    no notable dental hx     PULMONARY    pulmonary exam normal     OTHER FINDINGS                  ===============================================================    ADDITIONAL MEDICAL HISTORY      No LMP for male patient.    Body mass index is 23.9 kg/m.    Stop-Bang Score: 2    Date of last liquid: 04/21/21  Time of last liquid: 2100   Date of last solid: 04/21/21  Time of last solid: 2100       Scheduled  Procedure(s) with comments:  OPEN REDUCTION, HIP, INSERTION IM NAIL (GAMMA) - ASST: Y  Arthrex Katie Gladhill notified and confirmed 04-22-2021 1057 AR  Attending Physician: Katy Apo, MD  Admission Date:04/22/2021      Assessment:      Patient Active Problem List   Diagnosis    Cognitive decline    Observation for suspected genetic or metabolic condition    Pre-procedure lab exam    Impaired mobility and activities of daily living    Closed displaced intertrochanteric fracture of left femur, initial encounter          Past Medical History:   Diagnosis Date    Alzheimer disease     Dementia     Heart murmur     Hypertension     Prostate cancer     Prostate cancer     Prostate cancer             Past Surgical History:   Procedure Laterality Date    PROSTATE SURGERY         Social History     Social History     Tobacco Use    Smoking status: Never    Smokeless tobacco: Never   Substance Use Topics    Alcohol use: No      Allergies:   No Known Allergies  Hospital Medications:     Current Facility-Administered Medications   Medication Dose Route Frequency    [MAR Hold] carbidopa-levodopa  1.5 tablet Oral TID    [MAR Hold] cefTRIAXone  1 g Intravenous Q24H    [MAR Hold] heparin (porcine)  5,000 Units Subcutaneous Q12H [facility]    [MAR Hold] sertraline  100 mg Oral Daily     Home Medications:      Medications Prior to Admission   Medication Sig Dispense Refill Last Dose    Cyanocobalamin (Vitamin B-12) 2500 MCG SL Tab Place 2,500 mcg under the tongue      04/21/2021    nystatin (NYSTOP) powder Apply topically as needed Groin   04/21/2021    sertraline (ZOLOFT) 50 MG tablet Take 100 mg by mouth daily  3 04/21/2021    petrolatum (AQUAPHOR) ointment Apply topically as needed       senna (SENOKOT) 8.6 MG tablet Take 1 tablet by mouth daily as needed for Constipation       [DISCONTINUED] carbidopa-levodopa (SINEMET) 25-100 MG per tablet Take 1.5 tablets by mouth 3 (three) times daily 135 tablet 3 More than a month           Vitals:     Vitals:  04/22/21 1507   BP: 111/76   Pulse: 72   Resp: 18   Temp: 36.6 C (97.9 F)   SpO2: 95%       Labs:     Results     Procedure Component Value Units Date/Time    Type and Screen [[id number]] Collected: 04/22/21 1528    Specimen: Blood Updated: 04/22/21 1638     ABO Rh O POS     AB Screen Gel NEG    ABO/Rh [[id number]] Collected: 04/22/21 1543    Specimen: Blood Updated: 04/22/21 1626     ABO Rh O POS    2nd ABORH Request [[id number]] Collected: 04/22/21 1528     Updated: 04/22/21 1536     2nd ABORH Request FT    Prothrombin time/INR [[id number]] Collected: 04/22/21 1238    Specimen: Blood Updated: 04/22/21 1310     PT 12.0 sec      PT INR 1.0    COVID-19 (SARS-CoV-2) only ([facility]) asymptomatic admission - Hospitals [[id number]] Collected: 04/22/21 1015    Specimen: Nasopharyngeal Updated: 04/22/21 1105     Purpose of COVID testing Screening     SARS-CoV-2 Specimen Source Nasal Swab     SARS CoV 2 Overall Result Not Detected    Narrative:      o Collect and clearly label specimen type:  o PREFERRED-Upper respiratory specimen: One Nasal Swab in  Transport Media.  o Hand deliver to laboratory ASAP  Indication for testing->Extended care facility admission to  semi private room  Screening    Urinalysis Reflex to Microscopic Exam- Reflex to Culture [[id number]]   (Abnormal) Collected: 04/22/21 0757    Specimen: Urine, Clean Catch Updated: 04/22/21 0815     Urine Type Urine, Clean Ca     Color, UA Yellow     Clarity, UA Clear     Specific Gravity UA 1.020     Urine pH 6.0     Leukocyte Esterase, UA Moderate     Nitrite, UA Negative     Protein, UR Negative     Glucose, UA Negative     Ketones UA Negative     Urobilinogen, UA Normal mg/dL      Bilirubin, UA Negative     Blood, UA Negative     RBC, UA 3 - 5 /hpf      WBC, UA TNTC /hpf     GFR [[id number]] Collected: 04/22/21 0742     Updated: 04/22/21 0814     EGFR >60.0       Basic Metabolic Panel [[id number]]  (Abnormal) Collected: 04/22/21 0742    Specimen: Blood Updated: 04/22/21 0814     Glucose 118 mg/dL      BUN 75.6 mg/dL      Creatinine 0.9 mg/dL      Calcium 9.2 mg/dL      Sodium 433 mEq/L      Potassium 3.9 mEq/L      Chloride 107 mEq/L      CO2 20 mEq/L      Anion Gap 11.0    CBC and differential [[id number]] Collected: 04/22/21 0742    Specimen: Blood Updated: 04/22/21 0748     WBC 6.01 x10 3/uL      Hgb 13.5 g/dL      Hematocrit 60.6 %      Platelets 283 x10 3/uL      RBC 4.35 x10 6/uL      MCV 90.1 fL      MCH 31.0 pg  MCHC 34.4 g/dL      RDW 14 %      MPV 9.8 fL      Neutrophils 60.2 %      Lymphocytes Automated 27.8 %      Monocytes 9.7 %      Eosinophils Automated 1.2 %      Basophils Automated 0.8 %      Immature Granulocytes 0.3 %      Nucleated RBC 0.0 /100 WBC      Neutrophils Absolute 3.62 x10 3/uL      Lymphocytes Absolute Automated 1.67 x10 3/uL      Monocytes Absolute Automated 0.58 x10 3/uL      Eosinophils Absolute Automated 0.07 x10 3/uL      Basophils Absolute Automated 0.05 x10 3/uL      Immature Granulocytes Absolute 0.02 x10 3/uL      Absolute NRBC 0.00 x10 3/uL         Recent Labs   Lab 04/22/21  0742   WBC 6.01   Hgb 13.5   Hematocrit 39.2   Platelets 283        Lab Results   Component Value Date    HGB 13.5 04/22/2021    PLT 283 04/22/2021       Lab Results   Component Value Date    NA 138  04/22/2021    CL 107 04/22/2021    CO2 20 04/22/2021    K 3.9 04/22/2021    CREAT 0.9 04/22/2021    BUN 17.0 04/22/2021    GLU 118 (H) 04/22/2021       Lab Results   Component Value Date    AST 19 04/10/2021    ALT 15 04/10/2021    TROPI 0.01 04/10/2021       Lab Results   Component Value Date    PT 12.0 04/22/2021    PTT 30 07/21/2019    INR 1.0 04/22/2021       Lab Results   Component Value Date    COVID Nasal Swab 04/22/2021    COVID Not Detected 04/22/2021       Rads:     Radiology Results (24 Hour)     Procedure Component Value Units Date/Time    CT Head without Contrast [[id number]] Collected: 04/22/21 4627    Order Status: Completed Updated: 04/22/21 0911    Narrative:      HISTORY: Fall, dementia, head trauma    COMPARISON: CT head 04/10/2021    TECHNIQUE: CT of the head performed without intravenous contrast. The  following dose reduction techniques were utilized: automated exposure  control and/or adjustment of the mA and/or KV according to patient size,  and the use of an iterative reconstruction technique.    FINDINGS:  No midline shift, mass effect, parenchymal hemorrhage, or evidence of  acute territorial infarction. There are areas of hypodensity in the  periventricular white matter, which are likely a manifestation of  chronic small vessel disease.    The ventricles and sulci are prominent. No evidence of hydrocephalus. No  extra-axial fluid collections.    No significant paranasal sinus disease. The mastoids are clear. The  calvarium is intact.      Impression:         1.  No intracranial hemorrhage or acute intracranial findings.  2.  Parenchymal volume loss and chronic small vessel white matter  changes, similar compared to prior.    Brenton Grills, MD   04/22/2021 9:08 AM  CT Cervical Spine without Contrast [[id number]] Collected: 04/22/21 0858    Order Status: Completed Updated: 04/22/21 0907    Narrative:      HISTORY: Dementia, fall, neck trauma    COMPARISON: CT cervical spine  04/10/2021    TECHNIQUE: CT of the cervical spine performed without intravenous  contrast. Multiplanar reformatted images were created and reviewed. The  following dose reduction techniques were utilized: automated exposure  control and/or adjustment of the mA and/or KV according to patient size,  and the use of an iterative reconstruction technique.    FINDINGS:   There is no spondylolisthesis. The atlantooccipital and atlantoaxial  articulations are preserved. The odontoid is intact. Vertebral body  heights are preserved. No acute fracture. There are stable multilevel  degenerative findings. No prevertebral soft tissue swelling.      Impression:         No acute fracture or traumatic malalignment.    Brenton Grills, MD   04/22/2021 9:05 AM    XR Hip left 2-3 vw with Pelvis [[id number]] Collected: 04/22/21 0857    Order Status: Completed Updated: 04/22/21 0903    Narrative:      HISTORY: Injury to the left hip.    COMPARISON: None.    FINDINGS:  Radiographs of the left hip and femur.    There is acute, slightly impacted and comminuted fracture of the left  proximal femoral intertrochanteric region with mild to moderate coxa  varus deformity. Moderate surrounding soft tissue swelling. Diffuse  osteopenia. Both hips are located. No diastasis of the pubis symphysis  or sacroiliac joints. Radiation beads are noted at the prostate fossa,  likely from prior prostate cancer treatment.    No acute fracture or dislocation in the mid or distal femur. Diffuse  vascular atherosclerosis.      Impression:        1.  Acute, slightly impacted and comminuted fracture of the left  proximal femoral intertrochanteric region with mild to moderate coxa  varus deformity.    These critical results were discussed with and acknowledged by  Secundino Ginger, MD on 04/22/2021 9:00 AM.    Candi Leash, MD   04/22/2021 9:00 AM    Femur Left AP and Lateral [[id number]] Collected: 04/22/21 0857    Order Status: Completed Updated: 04/22/21 0903     Narrative:      HISTORY: Injury to the left hip.    COMPARISON: None.    FINDINGS:  Radiographs of the left hip and femur.    There is acute, slightly impacted and comminuted fracture of the left  proximal femoral intertrochanteric region with mild to moderate coxa  varus deformity. Moderate surrounding soft tissue swelling. Diffuse  osteopenia. Both hips are located. No diastasis of the pubis symphysis  or sacroiliac joints. Radiation beads are noted at the prostate fossa,  likely from prior prostate cancer treatment.    No acute fracture or dislocation in the mid or distal femur. Diffuse  vascular atherosclerosis.      Impression:        1.  Acute, slightly impacted and comminuted fracture of the left  proximal femoral intertrochanteric region with mild to moderate coxa  varus deformity.    These critical results were discussed with and acknowledged by  Secundino Ginger, MD on 04/22/2021 9:00 AM.    Candi Leash, MD   04/22/2021 9:00 AM    XR Chest  AP Portable [[id number]] Collected: 04/22/21 0846    Order Status: Completed Updated: 04/22/21 6440  Narrative:      HISTORY: Pain after injury.    COMPARISON: 04/10/2021.    FINDINGS:   The heart is normal size with minimal calcification in the thoracic  aorta. There is no lung consolidation, contusion, or pneumothorax. There  is bony osteopenia. No displaced fractures are detectable.      Impression:        No detectable active cardiopulmonary disease or traumatic abnormality in  the chest.    Wilmon Pali, MD   04/22/2021 8:47 AM                        Relevant Problems   No relevant active problems               Anesthesia Plan    ASA 3     general                     intravenous induction   Detailed anesthesia plan: general endotracheal      Post Op: plan for postoperative opioid use    Post op pain management: per surgeon    informed consent obtained    Plan discussed with CRNA.      pertinent labs reviewed             Signed by: Vernon Prey, MD 04/22/21 4:52  PM

## 2021-04-22 NOTE — Transfer of Care (Signed)
Anesthesia Transfer of Care Note    Patient: Danny Sutton    Procedures performed: Procedure(s):  OPEN REDUCTION, HIP, INSERTION IM NAIL (GAMMA)    Anesthesia type: General ETT    Patient location:Phase I PACU    Last vitals:   Vitals:    04/22/21 1921   BP: 141/71   Pulse: 75   Resp: 16   Temp: 37.2 C (99 F)   SpO2: 100%       Post pain: Patient not complaining of pain, continue current therapy      Mental Status:lethargic    Respiratory Function: tolerating face mask    Cardiovascular: stable    Nausea/Vomiting: patient not complaining of nausea or vomiting    Hydration Status: adequate    Post assessment: no apparent anesthetic complications, no reportable events and no evidence of recall    Signed by: Francee Piccolo, CRNA  04/22/21 7:28 PM

## 2021-04-22 NOTE — H&P (Signed)
[facility]      Patient: Danny Sutton  Date: 04/22/2021   DOB: 07-Sep-1940  Admission Date: 04/22/2021   MRN: [id number]  Attending: Katy Apo MD       Chief Complaint   Patient presents with    Fall and left hip pain      History Gathered From: EMS/ED notes,chart review,patient's daughter at bedside.Patient has advanced dementia,unable to provide any history.    HISTORY AND PHYSICAL     Danny Sutton with Advanced Alzheimer's dementia,Parkinson's disease,resident of [facility]-memory care unit was BIBA after a witnessed fall by a caregiver at his residence.Patient reportedly did not sustain a head injury or loss of consciousness but was unable to move his left leg due to severe pain.  Left hip X-ray :-   Acute, slightly impacted and comminuted fracture of the left  proximal femoral intertrochanteric region with mild to moderate coxa  varus deformity.  CT head : No acute intracranial process  CT cervical spine : No fracture    Past Medical History:Reviewed   Diagnosis Date    Alzheimer disease     Dementia     Heart murmur     Hypertension     Prostate cancer     Prostate cancer     Prostate cancer        Past Surgical History:Reviewed   Procedure Laterality Date    PROSTATE SURGERY         Prior to Admission medications,Reviewed   Medication Sig Start Date End Date Taking? Authorizing Provider   carbidopa-levodopa (SINEMET) 25-100 MG per tablet Take 1.5 tablets by mouth 3 (three) times daily 05/16/19   Delene Ruffini, MD   Cyanocobalamin (Vitamin B-12) 2500 MCG SL Tab Place 2,500 mcg under the tongue       [provider]   nystatin (NYSTOP) powder Apply topically as needed Groin    [provider]   petrolatum (AQUAPHOR) ointment Apply topically as needed    [provider]   senna (SENOKOT) 8.6 MG tablet Take 1 tablet by mouth daily as needed for Constipation    [provider]   sertraline (ZOLOFT) 50 MG tablet Take 100 mg by  mouth daily 03/11/18   [provider]     No Known Allergies    CODE STATUS: full code    PRIMARY CARE MD: Behiri, Amr H, DO    Family History,Reviewed   Problem Relation Age of Onset    No known problems Mother        Social History,reviewed     Tobacco Use    Smoking status: Never    Smokeless tobacco: Never   Vaping Use    Vaping Use: Never used   Substance Use Topics    Alcohol use: No    Drug use: No       REVIEW OF SYSTEMS     Patient has advanced dementia,unable to provide any history or obtain ROS other than those mentioned in the HPI.    PHYSICAL EXAM     Vital Signs (most recent): BP 115/61    Pulse 72    Temp 98.1 F (36.7 C) (Oral)    Resp 20    Wt 73.4 kg (161 lb 13.1 oz)    SpO2 95%    BMI 23.90 kg/m   Constitutional: Frail-appearing,elderly Sutton,Awake,not oriented to self or place or time,Does not follow any commands,resting in bed.  HEENT:  NC/AT, PERRL, no scleral icterus or conjunctival pallor  Neck: trachea midline, supple, no cervical or supraclavicular lymphadenopathy or masses  Cardiovascular: RRR, normal S1 S2,2/6 SEM  Respiratory: Normal rate. No retractions or increased work of breathing. Clear to auscultation and percussion bilaterally.  Gastrointestinal: +BS, non-distended, soft, non-tender, no rebound or guarding, no hepatosplenomegaly  Genitourinary: no suprapubic or costovertebral angle tenderness  Musculoskeletal: Left LE externally rotated,no ROM  due to pain,No clubbing, edema, or cyanosis. DP and radial pulses 2+ and symmetric,capillary refill normal  Skin: no rashes, jaundice or other lesions  Neurologic: Non focal   Psychiatric: Lethargic    LABS & IMAGING     Recent Results (from the past 24 hour(s))   CBC and differential    Collection Time: 04/22/21  7:42 AM   Result Value Ref Range    WBC 6.01 3.10 - 9.50 x10 3/uL    Hgb 13.5 12.5 - 17.1 g/dL    Hematocrit 09.3 23.5 - 49.6 %    Platelets 283 142 - 346 x10 3/uL    RBC 4.35 4.20 - 5.90 x10 6/uL    MCV 90.1 78.0 - 96.0  fL    MCH 31.0 25.1 - 33.5 pg    MCHC 34.4 31.5 - 35.8 g/dL    RDW 14 11 - 15 %    MPV 9.8 8.9 - 12.5 fL    Neutrophils 60.2 None %    Lymphocytes Automated 27.8 None %    Monocytes 9.7 None %    Eosinophils Automated 1.2 None %    Basophils Automated 0.8 None %    Immature Granulocytes 0.3 None %    Nucleated RBC 0.0 0.0 - 0.0 /100 WBC    Neutrophils Absolute 3.62 1.10 - 6.33 x10 3/uL    Lymphocytes Absolute Automated 1.67 0.42 - 3.22 x10 3/uL    Monocytes Absolute Automated 0.58 0.21 - 0.85 x10 3/uL    Eosinophils Absolute Automated 0.07 0.00 - 0.44 x10 3/uL    Basophils Absolute Automated 0.05 0.00 - 0.08 x10 3/uL    Immature Granulocytes Absolute 0.02 0.00 - 0.07 x10 3/uL    Absolute NRBC 0.00 0.00 - 0.00 x10 3/uL   Basic Metabolic Panel    Collection Time: 04/22/21  7:42 AM   Result Value Ref Range    Glucose 118 (H) 70 - 100 mg/dL    BUN 57.3 9.0 - 22.0 mg/dL    Creatinine 0.9 0.5 - 1.5 mg/dL    Calcium 9.2 7.9 - 25.4 mg/dL    Sodium 270 623 - 762 mEq/L    Potassium 3.9 3.5 - 5.3 mEq/L    Chloride 107 99 - 111 mEq/L    CO2 20 17 - 29 mEq/L    Anion Gap 11.0 5.0 - 15.0   GFR    Collection Time: 04/22/21  7:42 AM   Result Value Ref Range    EGFR >60.0     Urinalysis Reflex to Microscopic Exam- Reflex to Culture    Collection Time: 04/22/21  7:57 AM    Specimen: Urine, Clean Catch   Result Value Ref Range    Urine Type Urine, Clean Ca     Color, UA Yellow Colorless - Yellow    Clarity, UA Clear Clear - Hazy    Specific Gravity UA 1.020 1.001 - 1.035    Urine pH 6.0 5.0 - 8.0    Leukocyte Esterase, UA Moderate (A) Negative    Nitrite, UA Negative Negative    Protein, UR  Negative Negative    Glucose, UA Negative Negative    Ketones UA Negative Negative    Urobilinogen, UA Normal 0.2 - 2.0 mg/dL    Bilirubin, UA Negative Negative    Blood, UA Negative Negative    RBC, UA 3 - 5 0 - 5 /hpf    WBC, UA TNTC (A) 0 - 5 /hpf       MICROBIOLOGY:  Blood Culture: not done  Urine Culture: done  Antibiotics Started:  ceftriaxone    IMAGING (images personally reviewed and concur with radiologist unless otherwise stated below):  CT Head without Contrast   Final Result       1.  No intracranial hemorrhage or acute intracranial findings.   2.  Parenchymal volume loss and chronic small vessel white matter   changes, similar compared to prior.      Brenton Grills, MD    04/22/2021 9:08 AM      CT Cervical Spine without Contrast   Final Result       No acute fracture or traumatic malalignment.      Brenton Grills, MD    04/22/2021 9:05 AM      XR Chest  AP Portable   Final Result      No detectable active cardiopulmonary disease or traumatic abnormality in   the chest.      Wilmon Pali, MD    04/22/2021 8:47 AM      XR Hip left 2-3 vw with Pelvis   Final Result      1.  Acute, slightly impacted and comminuted fracture of the left   proximal femoral intertrochanteric region with mild to moderate coxa   varus deformity.      These critical results were discussed with and acknowledged by   Secundino Ginger, MD on 04/22/2021 9:00 AM.      Candi Leash, MD    04/22/2021 9:00 AM      Femur Left AP and Lateral   Final Result      1.  Acute, slightly impacted and comminuted fracture of the left   proximal femoral intertrochanteric region with mild to moderate coxa   varus deformity.      These critical results were discussed with and acknowledged by   Secundino Ginger, MD on 04/22/2021 9:00 AM.      Candi Leash, MD    04/22/2021 9:00 AM          EMERGENCY DEPARTMENT COURSE:  Orders Placed This Encounter   Procedures    Cervical Collar-Hard    Dressing    Urine culture    CT Head without Contrast    CT Cervical Spine without Contrast    XR Chest  AP Portable    XR Hip left 2-3 vw with Pelvis    Femur Left AP and Lateral    CBC and differential    Basic Metabolic Panel    Urinalysis Reflex to Microscopic Exam- Reflex to Culture    GFR    Wash    NO CPR - Allow Natural Death    ED Unit Sec Comm Order    ECG 12 lead    Saline lock IV       ASSESSMENT & PLAN      Danny Sutton is a 80 y.o. Sutton admitted under INPATIENT with Left hip intertrochanteric femur fracture s/p mechanical fall.    1. Left hip inter-trochanteric femur fracture s/p mechanical fall  Keep NPO for surgical intervention  Orthopedic consult.Plan for Left  hip ORIF with IM nailing today.D/W Dr Amalia Greenhouse  Patient is medically stable and cleared for above procedure  PT/OT evaluation/treatment post-op(This might be challenging as patient is unable to follow simple commands due to advanced dementia)  IV analgesics PRN  DVT prophylaxis with SC Heparin  Check H/H in AM    2. Advanced Alzheimer's dementia/Parkinson's disease  Resume home medications when he's able to eat  Supportive care  DNR/DNI code status(confirmed with patient's daughter)  Plan is [facility] PT/OT arrangements    Nutrition  NPO for surgery    DVT/VTE Prophylaxis  Heparin 5,000 units TIDq8      Signed,  Katy Apo, MD  04/22/2021 9:44 AM

## 2021-04-22 NOTE — Progress Notes (Signed)
Admission Note-  Patient admitted to Medical#  [id number] at 11:33. Hand off report received from ED electronic record.   Patient oriented to room, bed in lowest position, bed alarm activated, call bell within reach.   Fall prevention contract completed. Yes/No: YES  Patient informed of visitor policy.  Yes  Belongings Listed in flow sheet.     Brief Clinical Picture:  Neuro AXO; Resp - O2 status; Foley or Central lines; GI.     Patient fell this AM at the [facility] memory Unit.  FX. Left femur.  Plan for hip reduction.      Skin Assessment  Two nurse skin assessment performed with: Eike, RN    Areas observed with redness or injury: scabbed scalp laceration to back of head.      Devices present:None skin integrity under device: Within Normal Limits    Braden score <15. Yes/No  YES    Actions taken: N/A, wound consult entered, Optifoam placed, pressure relief, nutrition consult entered    Any significant admission event  N/A

## 2021-04-22 NOTE — Op Note (Signed)
FULL OPERATIVE NOTE    Date Time: 04/22/21   Patient Name: [facility]  Attending Physician: Surgeon(s):  Theressa Millard, MD      Date of Operation:   04/22/2021    Providers Performing:   Surgeon(s):  Montez Morita Salena Saner, MD    Circulator: Blanchie Serve., RN; Marcelino Scot, RN  Relief Circulator: Cordie Grice, RN  Relief Scrub: Barnetta Hammersmith  Scrub Person: Holland Falling; Atha Starks  Surgical Assistant: Mindi Curling    Operative Procedure:   Procedure(s):  OPEN REDUCTION, HIP, INSERTION IM NAIL (GAMMA)    Preoperative Diagnosis:   Pre-Op Diagnosis Codes:     * Closed fracture of left hip, initial encounter [S72.002A]    Postoperative Diagnosis:   Post-Op Diagnosis Codes:     * Closed fracture of left hip, initial encounter [S72.002A]    Anesthesia:   General    IV Fluids :   Per Anesthesia    Estimated Blood Loss:   50 mL    Implants:     Implant Name Type Inv. Item Serial No. Manufacturer Lot No. LRB No. Used Action   NAIL KIT TROCHANTR [id number] - [id number] Nail NAIL KIT TROCHANTR [id number]  [employer] [id number] Left 1 Implanted   SCREW TI LAG 10.5X110MM - [id number] Screw SCREW TI LAG 10.5X110MM  STRYKER TRAUMA [id number] Left 1 Implanted   SCREW T2 ALPHA LCKNG 5X37.5MM - [id number] Screw SCREW T2 ALPHA LCKNG 5X37.  STRYKER TRAUMA [id number] Left 1 Implanted     Indications:   The patient is an 80 year old male with a history of dementia who sustained a mechanical fall from standing height.  He was taken to the emergency room where imaging findings demonstrated a displaced left intertrochanteric hip fracture.  Surgery was recommended and after discussion with the daughter who is the medical decision maker she elected to proceed with operative intervention.  Risks included but were not limited to bleeding, infection, bloodclots, damage to surrounding nerves, arteries, and veins, continued pain, stiffness, need for reoperation, malunion, nonunion, need for  reoperation, complications of anesthesia and possible death. They were in understanding of these risk and wished to proceed.      Operative Notes:   The patient was seen in the preoperative holding area where they were identified by name, medical record number, and birthdate. The left lower extremity was marked with indelible ink. The patient received perioperative antibiotics and was taken to the operating room theater. A smooth induction of anesthesia was performed. The patient was then placed on the fracture table and a perineal post and bony prominences were padded. The left lower extremity was placed into a stirrup. Traction was then applied to the left lower extremity and a reduction of the fracture was performed with the aid of flouroscopy. The extremity was then prepped and draped in the normal sterile fashion with an alcohol and a chloraprep scrub. A timeout was then performed identifying the patient, operative site, surgery to be performed, all operating room personnel, and all were in agreement.    The procedure commenced with a 4cm longitudinal incision made proximal to the greater trochanter. A threaded guidepin was used to locate the entry point at the tip of the greater trochanter. Once an adequate entry point was confirmed the guide pin was advanced into the medullary canal to the level of the lesser trochanter. The pin was then overreamed with an entry reamer. A ball tipped guidewire was then advanced into the canal.  A size short nail was selected.     A separate stab incision was made through the alignment jig for placement of the lag screw. Once the trochar for the lag screw was placed a guidepin was used to center the lag screw position within the femoral head. Once position was confirmed a size length screw was selected. The guidepin was overreamed and the lag screw was placed. Slight compresson was applied and the set screw was inserted.    Through a small stab incision a 5.81mm  locking screw was placed through the jig.    Final flouroscopic views demonstrated reduction of the fracture with placement of the nail. All qounds were thoroughly irrigated and closed with 0 vicryl, 2-0 vicryl, and staples. A mepilexdressing and 4x4s and tegaderm were placed. This completed the procedure. The patient was then extubated without complication, transferred to the hospital bed and taken to the pacu in a stable condition.     Complications:   None     Condition:   Stable    Disposition:   To PACU    Signed by: Theressa Millard, MD, MD

## 2021-04-22 NOTE — Progress Notes (Signed)
Admission Note-  Patient admitted to Surgical unit at 2100. Hand off report received from [facility]    Nurse unable to orient, teach/direct patient at this time , bed in lowest position, bed alarm activated, call bell within reach.   Fall prevention protocols reviewed with family.  Family-daughter informed of visitor policy.  No belongings-patient was brought in from SNF after a fall, with no belongings per daughter.     Brief Clinical Picture:  Neuro AXO; 0 Resp - O2 status; RA      Skin Assessment  Two nurse skin assessment performed with: Vicky RN    Areas observed with redness or injury: right fore arm, sacrum/coccyx.    Devices present: None     Braden score <15. No    Actions taken: wound consult entered.

## 2021-04-22 NOTE — Consults (Signed)
CONSULTATION    Date Time: 04/22/21 5:52 PM  Patient Name: [facility]  Requesting Physician: Katy Apo, MD      Reason for Consultation:   Left hip fracture     Assessment:   Left hip intertrochanteric femur fracture     Plan:   I had a long discussion with the daughter who is the medical decision maker regarding treatment options. Surgery would be a left hip open reduction internal fixation with intramedullary nail.  Risks included but were not limited to bleeding, infection, bloodclots, damage to surrounding nerves, arteries, and veins, continued pain, stiffness, need for reoperation, malunion, nonunion, need for reoperation, complications of anesthesia and possible death. They were in understanding of these risk and wished to proceed.     History:   D[facility] is a 80 y.o. male who presents to the hospital on 04/22/2021 with left hip pain and inability to bear weight after a fall. Imaging in the ER demonstrated a left hip intertrochanteric femur fracture. He has a history of dementia and is in a memory care wing of his assisted living facility.     Past Medical History:     Past Medical History:   Diagnosis Date    Alzheimer disease     Dementia     Heart murmur     Hypertension     Prostate cancer     Prostate cancer     Prostate cancer        Past Surgical History:     Past Surgical History:   Procedure Laterality Date    PROSTATE SURGERY         Family History:     Family History   Problem Relation Age of Onset    No known problems Mother        Social History:     Social History     Socioeconomic History    Marital status: Married     Spouse name: Not on file    Number of children: Not on file    Years of education: Not on file    Highest education level: Not on file   Occupational History    Not on file   Tobacco Use    Smoking status: Never    Smokeless tobacco: Never   Vaping Use    Vaping Use: Never used   Substance and Sexual Activity    Alcohol use: No    Drug use: No    Sexual activity: Not on  file   Other Topics Concern    Not on file   Social History Narrative    Not on file     Social Determinants of Health     Financial Resource Strain: Not on file   Food Insecurity: Not on file   Transportation Needs: Not on file   Physical Activity: Not on file   Stress: Not on file   Social Connections: Not on file   Intimate Partner Violence: Not on file   Housing Stability: Not on file       Allergies:   No Known Allergies    Medications:     Current Facility-Administered Medications   Medication Dose Route Frequency    [MAR Hold] carbidopa-levodopa  1.5 tablet Oral TID    ceFAZolin  2 g Intravenous Once    [MAR Hold] cefTRIAXone  1 g Intravenous Q24H    [MAR Hold] heparin (porcine)  5,000 Units Subcutaneous Q12H [facility]    [MAR Hold] sertraline  100  mg Oral Daily    tranexamic acid  1,000 mg Intravenous Once       Physical Exam:     Vitals:    04/22/21 1507   BP: 111/76   Pulse: 72   Resp: 18   Temp: 97.9 F (36.6 C)   SpO2: 95%       Intake and Output Summary (Last 24 hours) at Date Time  No intake or output data in the 24 hours ending 04/22/21 1752    Extremeties: Focused examination of the left lower extremety demonstrates intact skin. There is no significant bruising. The left lower extremity is held in a shortened and externally rotated position. There is tenderness to palpation at the greater trochanter. No tendetness to palpation of the knee or ankle. He Can flex and extend her ankle and toes randomly. Palpable DP and PT pulses with brisk cap refill x5.   Examination of the right lower extremity demonstrates intact skin with no tenderness to palpation across bony prominences. He is NVI distally.     Labs Reviewed:     Results       Procedure Component Value Units Date/Time    Type and Screen [[id number]] Collected: 04/22/21 1528    Specimen: Blood Updated: 04/22/21 1638     ABO Rh O POS     AB Screen Gel NEG    ABO/Rh [[id number]] Collected: 04/22/21 1543    Specimen: Blood Updated: 04/22/21 1626     ABO Rh O  POS    2nd ABORH Request [[id number]] Collected: 04/22/21 1528     Updated: 04/22/21 1536     2nd ABORH Request FT    Prothrombin time/INR [[id number]] Collected: 04/22/21 1238    Specimen: Blood Updated: 04/22/21 1310     PT 12.0 sec      PT INR 1.0    COVID-19 (SARS-CoV-2) only ([facility]) asymptomatic admission - Hospitals [[id number]] Collected: 04/22/21 1015    Specimen: Nasopharyngeal Updated: 04/22/21 1105     Purpose of COVID testing Screening     SARS-CoV-2 Specimen Source Nasal Swab     SARS CoV 2 Overall Result Not Detected    Narrative:      o Collect and clearly label specimen type:  o PREFERRED-Upper respiratory specimen: One Nasal Swab in  Transport Media.  o Hand deliver to laboratory ASAP  Indication for testing->Extended care facility admission to  semi private room  Screening    Urinalysis Reflex to Microscopic Exam- Reflex to Culture [[id number]]  (Abnormal) Collected: 04/22/21 0757    Specimen: Urine, Clean Catch Updated: 04/22/21 0815     Urine Type Urine, Clean Ca     Color, UA Yellow     Clarity, UA Clear     Specific Gravity UA 1.020     Urine pH 6.0     Leukocyte Esterase, UA Moderate     Nitrite, UA Negative     Protein, UR Negative     Glucose, UA Negative     Ketones UA Negative     Urobilinogen, UA Normal mg/dL      Bilirubin, UA Negative     Blood, UA Negative     RBC, UA 3 - 5 /hpf      WBC, UA TNTC /hpf     GFR [[id number]] Collected: 04/22/21 0742     Updated: 04/22/21 0814     EGFR >60.0       Basic Metabolic Panel [[id number]]  (Abnormal) Collected: 04/22/21 0742    Specimen:  Blood Updated: 04/22/21 0814     Glucose 118 mg/dL      BUN 96.7 mg/dL      Creatinine 0.9 mg/dL      Calcium 9.2 mg/dL      Sodium 893 mEq/L      Potassium 3.9 mEq/L      Chloride 107 mEq/L      CO2 20 mEq/L      Anion Gap 11.0    CBC and differential [[id number]] Collected: 04/22/21 0742    Specimen: Blood Updated: 04/22/21 0748     WBC 6.01 x10 3/uL      Hgb 13.5 g/dL      Hematocrit 58.5 %      Platelets 283  x10 3/uL      RBC 4.35 x10 6/uL      MCV 90.1 fL      MCH 31.0 pg      MCHC 34.4 g/dL      RDW 14 %      MPV 9.8 fL      Neutrophils 60.2 %      Lymphocytes Automated 27.8 %      Monocytes 9.7 %      Eosinophils Automated 1.2 %      Basophils Automated 0.8 %      Immature Granulocytes 0.3 %      Nucleated RBC 0.0 /100 WBC      Neutrophils Absolute 3.62 x10 3/uL      Lymphocytes Absolute Automated 1.67 x10 3/uL      Monocytes Absolute Automated 0.58 x10 3/uL      Eosinophils Absolute Automated 0.07 x10 3/uL      Basophils Absolute Automated 0.05 x10 3/uL      Immature Granulocytes Absolute 0.02 x10 3/uL      Absolute NRBC 0.00 x10 3/uL             Rads:   Radiological Procedure reviewed.     Signed by: Theressa Millard, MD

## 2021-04-22 NOTE — Plan of Care (Signed)
Patient admitted from ED this shift, daughter, Darl Pikes at bedside.  Patient disoriented x 4.  High fall precautions in place.  External catheter applied.  Monitoring for pain.  Plan for L. Hip reductions today, move to surgical unit w/assigned sitter.  VSS on room air.  Will continue to monitor.         Problem: Safety  Goal: Patient will be free from injury during hospitalization  Outcome: Progressing  Flowsheets (Taken 04/22/2021 1514)  Patient will be free from injury during hospitalization:   Assess patient's risk for falls and implement fall prevention plan of care per policy   Provide and maintain safe environment   Ensure appropriate safety devices are available at the bedside   Hourly rounding   Use appropriate transfer methods   Include patient/ family/ care giver in decisions related to safety     Problem: Pain  Goal: Pain at adequate level as identified by patient  Outcome: Progressing  Flowsheets (Taken 04/22/2021 1514)  Pain at adequate level as identified by patient:   Identify patient comfort function goal   Assess for risk of opioid induced respiratory depression, including snoring/sleep apnea. Alert healthcare team of risk factors identified.   Evaluate patient's satisfaction with pain management progress   Reassess pain within 30-60 minutes of any procedure/intervention, per Pain Assessment, Intervention, Reassessment (AIR) Cycle   Assess pain on admission, during daily assessment and/or before any "as needed" intervention(s)

## 2021-04-22 NOTE — ED Notes (Signed)
[facility] EMERGENCY DEPARTMENT  ED NURSING NOTE FOR THE RECEIVING INPATIENT NURSE   ED NURSE Vernell Leep [phone]   ED CHARGE RN Herbert Seta   ADMISSION INFORMATION   Danny Sutton is a 80 y.o. male admitted with an ED diagnosis of:    1. Closed displaced intertrochanteric fracture of left femur, initial encounter    2. Cystitis    3. Fall, initial encounter    4. Severe dementia with other behavioral disturbance, unspecified dementia type         Isolation: None   Allergies: Patient has no known allergies.   Holding Orders confirmed? Yes   Belongings Documented? Yes   Home medications sent to pharmacy confirmed? N/A   NURSING CARE   Patient Comes From:   Mental Status: Assisted Living  confused   ADL: Dependent with ADLs   Ambulation: Unable to assess   Pertinent Information  and Safety Concerns: dementia, parkinsons, unresponsive     CT / NIH   CT Head ordered on this patient?  Yes   NIH/Dysphagia assessment done prior to admission? No   VITAL SIGNS (at the time of this note)      Vitals:    04/22/21 1011   BP: 115/61   Pulse: 72   Resp: 20   Temp: 98.1 F (36.7 C)   SpO2: 98%

## 2021-04-22 NOTE — Progress Notes (Signed)
04/22/21 0958   ED CM Interventions   ED CM Interventions Initiation of early [place] Planning for ADMITTED patients     Everardo Pacific, BSN, RN CEN  Case Manager  [place] [facility]  [phone]

## 2021-04-22 NOTE — Anesthesia Postprocedure Evaluation (Signed)
Anesthesia Post Evaluation    Patient: [facility]    Procedure(s):  OPEN REDUCTION, HIP, INSERTION IM NAIL (GAMMA)    Anesthesia type: general    Last Vitals:   Vitals Value Taken Time   BP 155/68 04/22/21 1930   Temp  04/22/21 1955   Pulse 68 04/22/21 1930   Resp 20 04/22/21 1930   SpO2 100 % 04/22/21 1930                 Anesthesia Post Evaluation:     Patient Evaluated: PACU  Patient Participation: complete - patient participated  Level of Consciousness: awake  Pain Score: 1  Pain Management: adequate  Multimodal analgesia pain management approach    Airway Patency: patent    Anesthetic complications: No      PONV Status: none    Cardiovascular status: acceptable  Respiratory status: acceptable  Hydration status: acceptable        Signed by: Daine Gip, MD, 04/22/2021 7:55 PM

## 2021-04-22 NOTE — ED Triage Notes (Addendum)
[facility] from [facility]. Per EMS pt had witnessed fall by caregiver, states pt loss balance and fell from standing position, denied any head strike or LOC. Per EMS they were unable to place a c-collar, pt has tear on left elbow and is wincing with left leg movement. Hx dementia and parkinsons, not on any blood thinners.

## 2021-04-22 NOTE — ED Provider Notes (Signed)
[facility] History and Physical Exam     Patient Name: Danny Sutton, Danny Sutton  Encounter Date:  04/22/2021  Attending Physician: Elray Mcgregor, MD  Patient DOB:  01-Jul-1940  MRN:  [id number]  Room:  V409/W119-14    Chief Complaint     Chief Complaint   Patient presents with    Fall     History of Presenting Illness     80 y.o. male with dementia, original EMS report radioed before patient arrived apparently was that he fell downstairs and had a nosebleed, but neither of these is the case it seems.    His caregiver was present and witnessed that he lost his balance and had a ground-level fall, unclear whether he hit his head or not, has a left arm skin tear and seems to have left leg injury.  He cannot provide any history because he has severe dementia.  There is a DNR form with his paperwork.    No syncope.    HPI caveat invoked because of dementia            PMD:  Behiri, Amr H, DO    Nursing Notes Review  Nursing Notes were reviewed.     Previous Records Review  Previous records were reviewed to the extent practicable for the current presentation.          Physical Exam     Vital Signs  BP 141/73    Pulse 85    Temp 97.7 F (36.5 C) (Axillary)    Resp 18    Wt 73.4 kg    SpO2 96%    BMI 23.90 kg/m     Review of Vital Signs  The patient's vital signs and oxygen saturation were reviewed and interpreted by me, Elray Mcgregor, MD.    Physical Exam    Constitutional: No acute distress but appears uncomfortable.  He is not answering questions, he keeps his eyes closed, he will occasionally say the word no but not in response to questions. Coloration not ashen.   Eyes: No conjunctival discharge. EOMI when I can get his eyes to open.   Head, Ears, Nose, Mouth, Throat: Normocephalic. Moist mucous membranes. No facial tenderness. No mouth injury.   Neck: No obvious neck deformities. No tracheal deviation. No cervical spine tenderness.  He arrives with towels to support his C-spine, apparently would not  support a collar before arrival  Cardiovascular: No obvious gallops. Normal capillary refill. Strong peripheral pulses.  Respiratory/Chest: No obvious chest wall asymmetry. No resp distress. No chest wall tenderness. Lungs are clear bilaterally.   Gastrointestinal/Abdominal:  Soft abdomen. No abdominal distention. No tenderness to palpation, no rigidity, no guarding. + bowel sounds. No peritoneal signs, benign exam.  Musculoskeletal: He has a minor left skin tear just proximal to the elbow.  Its not tender.  His left leg is held in external rotation.  He has left hip tenderness and pain with any left hip range of motion.  Neurovascularly intact in the left leg.  Does not seem to have any left thigh tenderness.  Otherwise, normal ROM with no focal extremity tenderness and no deformity.   Back: No deformity. No significant scoliosis. No thoracic or lumbar spine tenderness.   Neurological: Alert, dementia. No acute focal deficits. Normal strength and sensation of extremities.   Skin: Warm skin. No acute rash. Not mottled.        Medical Decision Making     Hx & Exam Synthesis, Differential Diagnosis, Plan  Left hip fracture: Fall appears to have been mechanical in nature.  No other signs of injury.  Will likely need surgery.  I talked to his daughter for a long time.  Will admit to medicine with Ortho consult      ED Course, Monitors, EKG, Critical Care, Splints, Consults, Reevaluation, etc           ED Course as of 04/22/21 1408   Fri Apr 22, 2021   0756 His daughter is here, I updated her.  [CM]   0756 EKG Interpretation  12 Lead EKG interpreted by Dr Arvilla Market shows:  Normal sinus rhythm.  Normal rate. 70  Normal conduction intervals.   Very low volts  No acute significant ST or T wave changes.   Nonspecific EKG.    [CM]   [phone] ED u/s done given low volts on ekg: no pericardial effusion. Adequate squeeze.  [CM]   [id number] +intertroch. Paged ortho [CM]   [id number] I spoke c dr Georges Mouse, ortho. Dr Leavy Cella is in surgery currently. Dr  Georges Mouse recommended medical admission and they will consult.  [CM]   1914 I spoke c Leavy Cella, he will consult.  [CM]   0936 +UA, abx ordered. May be related to his fall this morning.  [CM]   [phone] Salley Scarlet accepts, med inpt.  [CM]      ED Course User Index  [CM] Elray Mcgregor, MD         Past Medical History     Past Medical History:   Diagnosis Date    Alzheimer disease     Dementia     Heart murmur     Hypertension     Prostate cancer     Prostate cancer     Prostate cancer        Medications       Current Facility-Administered Medications:     carbidopa-levodopa (SINEMET) 25-100 MG per tablet 1.5 tablet, 1.5 tablet, Oral, TID, Katy Apo, MD    [START ON 04/23/2021] cefTRIAXone (ROCEPHIN) 1 g in sodium chloride 0.9 % 100 mL IVPB mini-bag plus, 1 g, Intravenous, Q24H, Kunwar, Bidhan, MD    dextrose  5 % and 0.45 % NaCl infusion, , Intravenous, Continuous, Katy Apo, MD    Nursing communication: Adult Hypoglycemia Treatment Algorithm, , , Until Discontinued **AND** glucagon (rDNA) (GLUCAGEN) injection 1 mg, 1 mg, Intramuscular, PRN **AND** dextrose 5 % bolus 250 mL, 250 mL, Intravenous, PRN **AND** dextrose 50 % bolus 25 g, 25 g, Intravenous, PRN **AND** dextrose (D10W) 10% bolus 250 mL, 25 g, Intravenous, PRN, Salley Scarlet, Bidhan, MD    heparin (porcine) injection 5,000 Units, 5,000 Units, Subcutaneous, Q12H SCH, Salley Scarlet, Bidhan, MD    melatonin tablet 3 mg, 3 mg, Oral, QHS PRN, Katy Apo, MD    morphine injection 2 mg, 2 mg, Intravenous, Q4H PRN, Katy Apo, MD    naloxone (NARCAN) injection 0.2 mg, 0.2 mg, Intravenous, PRN, Katy Apo, MD    nystatin (NYSTOP) powder, , Topical, PRN, Katy Apo, MD    ondansetron (ZOFRAN) injection 4 mg, 4 mg, Intravenous, Q6H PRN, Katy Apo, MD    senna (SENOKOT) tablet 8.6 mg, 8.6 mg, Oral, Daily PRN, Katy Apo, MD    [START ON 04/23/2021] sertraline (ZOLOFT) tablet 100 mg, 100 mg, Oral, Daily, Katy Apo, MD    Allergies     No Known  Allergies    Medical history, medications, and allergies reviewed.     Past Surgical History     Past Surgical  History:   Procedure Laterality Date    PROSTATE SURGERY         Family History   The family history is not significantly contributory to current presentation.   Family History   Problem Relation Age of Onset    No known problems Mother        Social History   Social history is not significantly contributory to the patient's current presentation.   Social History     Occupational History    Not on file   Tobacco Use    Smoking status: Never    Smokeless tobacco: Never   Vaping Use    Vaping Use: Never used   Substance and Sexual Activity    Alcohol use: No    Drug use: No    Sexual activity: Not on file       Review of Systems     ROS caveat invoked because of dementia    ED Medications Administered     ED Medication Orders (From admission, onward)      Start Ordered     Status Ordering Provider    04/22/21 1000 04/22/21 0959  morphine injection 4 mg  Once        Route: Intravenous  Ordered Dose: 4 mg     Last MAR action: Given Nonna Renninger N    04/22/21 5621 04/22/21 0936  cefTRIAXone (ROCEPHIN) injection 1 g  Once        Route: Intravenous  Ordered Dose: 1 g     Last MAR action: Given Faigy Stretch N    04/22/21 0721 04/22/21 0720  tetanus-diphth-acell pertussis (BOOSTRIX) injection 0.5 mL  Once        Route: Intramuscular  Ordered Dose: 0.5 mL     Last MAR action: Given Brittanya Winburn N    04/22/21 0721 04/22/21 0720  morphine injection 4 mg  Once        Route: Intravenous  Ordered Dose: 4 mg     Last MAR action: Given Betsabe Iglesia N    04/22/21 0721 04/22/21 0720  ondansetron (ZOFRAN) injection 4 mg  Once        Route: Intravenous  Ordered Dose: 4 mg     Last MAR action: Given Sewell Pitner N            Orders Placed During This Encounter     Orders Placed This Encounter   Procedures    Dressing    Urine culture    COVID-19 (SARS-CoV-2) only ([facility]) asymptomatic admission  - Hospitals    CT Head without Contrast    CT Cervical Spine without Contrast    XR Chest  AP Portable    XR Hip left 2-3 vw with Pelvis    Femur Left AP and Lateral    CBC and differential    Basic Metabolic Panel    Urinalysis Reflex to Microscopic Exam- Reflex to Culture    GFR    CBC and differential    Comprehensive metabolic panel    Magnesium    Prothrombin time/INR    Diet NPO effective now    Wash    Vital signs    Pulse Oximetry    Progressive Mobility Protocol    Notify physician    NSG Communication: Glucose POCT order (PRN hypoglycemia)    I/O    Height    Weight    Skin assessment    Nursing communication: Adult Hypoglycemia Treatment Algorithm    Place  sequential compression device    Maintain sequential compression device    Education: Activity    Education: Disease Process & Condition    Education: Pain Management    Education: Falls Risk    Education: Smoking Cessation    NO CPR - Allow Natural Death    ED Unit Sec Comm Order    ECG 12 lead    Saline lock IV    Saline lock IV    Adult Admit to Inpatient       Diagnostic Study Results     The results of the diagnostic studies below were reviewed by the ED provider:    Labs  Results       Procedure Component Value Units Date/Time    Prothrombin time/INR [[id number]] Collected: 04/22/21 1238    Specimen: Blood Updated: 04/22/21 1310     PT 12.0 sec      PT INR 1.0    COVID-19 (SARS-CoV-2) only ([facility]) asymptomatic admission - Hospitals [[id number]] Collected: 04/22/21 1015    Specimen: Nasopharyngeal Updated: 04/22/21 1105     Purpose of COVID testing Screening     SARS-CoV-2 Specimen Source Nasal Swab     SARS CoV 2 Overall Result Not Detected    Narrative:      o Collect and clearly label specimen type:  o PREFERRED-Upper respiratory specimen: One Nasal Swab in  Transport Media.  o Hand deliver to laboratory ASAP  Indication for testing->Extended care facility admission to  semi private room  Screening    Urinalysis Reflex to Microscopic Exam-  Reflex to Culture [[id number]]  (Abnormal) Collected: 04/22/21 0757    Specimen: Urine, Clean Catch Updated: 04/22/21 0815     Urine Type Urine, Clean Ca     Color, UA Yellow     Clarity, UA Clear     Specific Gravity UA 1.020     Urine pH 6.0     Leukocyte Esterase, UA Moderate     Nitrite, UA Negative     Protein, UR Negative     Glucose, UA Negative     Ketones UA Negative     Urobilinogen, UA Normal mg/dL      Bilirubin, UA Negative     Blood, UA Negative     RBC, UA 3 - 5 /hpf      WBC, UA TNTC /hpf     GFR [[id number]] Collected: 04/22/21 0742     Updated: 04/22/21 0814     EGFR >60.0       Basic Metabolic Panel [[id number]]  (Abnormal) Collected: 04/22/21 0742    Specimen: Blood Updated: 04/22/21 0814     Glucose 118 mg/dL      BUN 24.4 mg/dL      Creatinine 0.9 mg/dL      Calcium 9.2 mg/dL      Sodium 010 mEq/L      Potassium 3.9 mEq/L      Chloride 107 mEq/L      CO2 20 mEq/L      Anion Gap 11.0    CBC and differential [[id number]] Collected: 04/22/21 0742    Specimen: Blood Updated: 04/22/21 0748     WBC 6.01 x10 3/uL      Hgb 13.5 g/dL      Hematocrit 03.4 %      Platelets 283 x10 3/uL      RBC 4.35 x10 6/uL      MCV 90.1 fL      MCH 31.0 pg  MCHC 34.4 g/dL      RDW 14 %      MPV 9.8 fL      Neutrophils 60.2 %      Lymphocytes Automated 27.8 %      Monocytes 9.7 %      Eosinophils Automated 1.2 %      Basophils Automated 0.8 %      Immature Granulocytes 0.3 %      Nucleated RBC 0.0 /100 WBC      Neutrophils Absolute 3.62 x10 3/uL      Lymphocytes Absolute Automated 1.67 x10 3/uL      Monocytes Absolute Automated 0.58 x10 3/uL      Eosinophils Absolute Automated 0.07 x10 3/uL      Basophils Absolute Automated 0.05 x10 3/uL      Immature Granulocytes Absolute 0.02 x10 3/uL      Absolute NRBC 0.00 x10 3/uL             Radiologic Studies  Radiology Results (24 Hour)       Procedure Component Value Units Date/Time    CT Head without Contrast [[id number]] Collected: 04/22/21 0905    Order Status: Completed  Updated: 04/22/21 0911    Narrative:      HISTORY: Fall, dementia, head trauma    COMPARISON: CT head 04/10/2021    TECHNIQUE: CT of the head performed without intravenous contrast. The  following dose reduction techniques were utilized: automated exposure  control and/or adjustment of the mA and/or KV according to patient size,  and the use of an iterative reconstruction technique.    FINDINGS:  No midline shift, mass effect, parenchymal hemorrhage, or evidence of  acute territorial infarction. There are areas of hypodensity in the  periventricular white matter, which are likely a manifestation of  chronic small vessel disease.    The ventricles and sulci are prominent. No evidence of hydrocephalus. No  extra-axial fluid collections.    No significant paranasal sinus disease. The mastoids are clear. The  calvarium is intact.      Impression:         1.  No intracranial hemorrhage or acute intracranial findings.  2.  Parenchymal volume loss and chronic small vessel white matter  changes, similar compared to prior.    Brenton Grills, MD   04/22/2021 9:08 AM    CT Cervical Spine without Contrast [[id number]] Collected: 04/22/21 0858    Order Status: Completed Updated: 04/22/21 0907    Narrative:      HISTORY: Dementia, fall, neck trauma    COMPARISON: CT cervical spine 04/10/2021    TECHNIQUE: CT of the cervical spine performed without intravenous  contrast. Multiplanar reformatted images were created and reviewed. The  following dose reduction techniques were utilized: automated exposure  control and/or adjustment of the mA and/or KV according to patient size,  and the use of an iterative reconstruction technique.    FINDINGS:   There is no spondylolisthesis. The atlantooccipital and atlantoaxial  articulations are preserved. The odontoid is intact. Vertebral body  heights are preserved. No acute fracture. There are stable multilevel  degenerative findings. No prevertebral soft tissue swelling.      Impression:          No acute fracture or traumatic malalignment.    Brenton Grills, MD   04/22/2021 9:05 AM    XR Hip left 2-3 vw with Pelvis [[id number]] Collected: 04/22/21 0857    Order Status: Completed Updated: 04/22/21 0903    Narrative:  HISTORY: Injury to the left hip.    COMPARISON: None.    FINDINGS:  Radiographs of the left hip and femur.    There is acute, slightly impacted and comminuted fracture of the left  proximal femoral intertrochanteric region with mild to moderate coxa  varus deformity. Moderate surrounding soft tissue swelling. Diffuse  osteopenia. Both hips are located. No diastasis of the pubis symphysis  or sacroiliac joints. Radiation beads are noted at the prostate fossa,  likely from prior prostate cancer treatment.    No acute fracture or dislocation in the mid or distal femur. Diffuse  vascular atherosclerosis.      Impression:        1.  Acute, slightly impacted and comminuted fracture of the left  proximal femoral intertrochanteric region with mild to moderate coxa  varus deformity.    These critical results were discussed with and acknowledged by  Secundino Ginger, MD on 04/22/2021 9:00 AM.    Candi Leash, MD   04/22/2021 9:00 AM    Femur Left AP and Lateral [[id number]] Collected: 04/22/21 0857    Order Status: Completed Updated: 04/22/21 0903    Narrative:      HISTORY: Injury to the left hip.    COMPARISON: None.    FINDINGS:  Radiographs of the left hip and femur.    There is acute, slightly impacted and comminuted fracture of the left  proximal femoral intertrochanteric region with mild to moderate coxa  varus deformity. Moderate surrounding soft tissue swelling. Diffuse  osteopenia. Both hips are located. No diastasis of the pubis symphysis  or sacroiliac joints. Radiation beads are noted at the prostate fossa,  likely from prior prostate cancer treatment.    No acute fracture or dislocation in the mid or distal femur. Diffuse  vascular atherosclerosis.      Impression:        1.  Acute, slightly  impacted and comminuted fracture of the left  proximal femoral intertrochanteric region with mild to moderate coxa  varus deformity.    These critical results were discussed with and acknowledged by  Secundino Ginger, MD on 04/22/2021 9:00 AM.    Candi Leash, MD   04/22/2021 9:00 AM    XR Chest  AP Portable [[id number]] Collected: 04/22/21 0846    Order Status: Completed Updated: 04/22/21 0849    Narrative:      HISTORY: Pain after injury.    COMPARISON: 04/10/2021.    FINDINGS:   The heart is normal size with minimal calcification in the thoracic  aorta. There is no lung consolidation, contusion, or pneumothorax. There  is bony osteopenia. No displaced fractures are detectable.      Impression:        No detectable active cardiopulmonary disease or traumatic abnormality in  the chest.    Wilmon Pali, MD   04/22/2021 8:47 AM            Scribe and MD Attestations     Rendering Provider: Elray Mcgregor, MD          Diagnosis and Disposition     Clinical Impression  1. Closed displaced intertrochanteric fracture of left femur, initial encounter    2. Cystitis    3. Fall, initial encounter    4. Severe dementia with other behavioral disturbance, unspecified dementia type        Disposition  ED Disposition       ED Disposition   Admit    Condition   --  Date/Time   Fri Apr 22, 2021  9:59 AM    Comment   Admitting Physician: Katy Apo [[id number]]   Service:: Orthopedics [119]   Estimated Length of Stay: > or = to 2 midnights   Tentative Discharge Plan?: Home or Self Care [1]   Does patient need telemetry?: No   Special Needs:: AMS   Special Needs:: Fall Risk                     Prescriptions       Current Discharge Medication List             Elray Mcgregor, MD  04/22/21 1408

## 2021-04-22 NOTE — ED Notes (Signed)
Bed: A04  Expected date:   Expected time:   Means of arrival:   Comments:  Medic 440

## 2021-04-22 NOTE — ED Notes (Signed)
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

## 2021-04-22 NOTE — Progress Notes (Signed)
Initial Case Management Assessment and Discharge Planning  [facility]   Patient Name: Danny Sutton, Danny Sutton   Date of Birth March 13, 1941   Attending Physician: Katy Apo, MD   Primary Care Physician: Kerin Perna, DO   Length of Stay 0   Reason for Consult / Chief Complaint Fall        Situation   Admission DX:   1. Closed displaced intertrochanteric fracture of left femur, initial encounter    2. Cystitis    3. Fall, initial encounter    4. Severe dementia with other behavioral disturbance, unspecified dementia type        A/O Status: Not Oriented    LACE Score: 5    Patient admitted from: ER  Admission Status: inpatient    Health Care Agent: Child-- Daughter  Name: Raul Del  Phone number: [phone]       Background     Advanced directive:   <no information>    Code Status:   NO CPR  -  ALLOW NATURAL DEATH     Residence: Other: [facility]  [phone] [facility] [address] Piedad Climes   RM P-8    PCP: Kerin Perna, DO  Patient Contact:   [phone] (home)     There is no such number on file (mobile).     Emergency contact:   Extended Emergency Contact Information  Primary Emergency Contact: Perlman,Susan  Address: [address]           [place], [place] [address] [place]  Home Phone: [phone]  Mobile Phone: [phone]  Relation: Daughter  Secondary Emergency Contact: Arnell Asal  Mobile Phone: [phone]  Relation: Son in law  Preferred language: English  Interpreter needed? No      ADL/IADL's: Incontinent  Previous Level of function: 1 Total Assist    DME: Wheelchair - manual    Pharmacy:     [id number] [place], [place] - [address] AT [facility]  [address] [place] [address]  Phone: [phone] Fax: [phone]    [facility] [place], [place] - [address] [address]  [address]  [place] [address]  Phone: [phone] Fax: [phone]      Prescription Coverage: Yes    Home Health: The patient is  not currently receiving home health services.    Previous SNF/AR: [facility]    COVID Vaccine Status: vaccinated    Date First IMM given: 04/22/21  UAI on file?: No  Transport for discharge? Mode of transportation: Ambuance/Ambulet/Van  Agreeable to SNF: [facility]  post-discharge:  Yes     Assessment   04/22/21 ADM: fall,  Closed displaced intertrochanteric fracture of left femur, initial encounter  .   PMHX: Dementia, Parkinson's.  Resides at the [facility] unit (highest level of care).  Ambulatory with assistance.  very unsteady per dghtr.  DME: w/c. Requires assist with all ADLs.  Incontinent. DNR.     BARRIERS TO DISCHARGE: PT needs     Recommendation   D/C Plan A: SNF              Everardo Pacific, BSN, RN CEN  Case Manager  [facility]  [phone]

## 2021-04-23 LAB — COMPREHENSIVE METABOLIC PANEL
ALT: 14 U/L (ref 0–55)
AST (SGOT): 21 U/L (ref 5–41)
Albumin/Globulin Ratio: 1.6 (ref 0.9–2.2)
Albumin: 3.1 g/dL — ABNORMAL LOW (ref 3.5–5.0)
Alkaline Phosphatase: 114 U/L (ref 37–117)
Anion Gap: 12 (ref 5.0–15.0)
BUN: 14 mg/dL (ref 9.0–28.0)
Bilirubin, Total: 0.7 mg/dL (ref 0.2–1.2)
CO2: 22 mEq/L (ref 17–29)
Calcium: 8.5 mg/dL (ref 7.9–10.2)
Chloride: 107 mEq/L (ref 99–111)
Creatinine: 1 mg/dL (ref 0.5–1.5)
Globulin: 2 g/dL (ref 2.0–3.6)
Glucose: 183 mg/dL — ABNORMAL HIGH (ref 70–100)
Potassium: 4.3 mEq/L (ref 3.5–5.3)
Protein, Total: 5.1 g/dL — ABNORMAL LOW (ref 6.0–8.3)
Sodium: 141 mEq/L (ref 135–145)

## 2021-04-23 LAB — CBC AND DIFFERENTIAL
Absolute NRBC: 0 10*3/uL (ref 0.00–0.00)
Basophils Absolute Automated: 0.02 10*3/uL (ref 0.00–0.08)
Basophils Automated: 0.2 %
Eosinophils Absolute Automated: 0 10*3/uL (ref 0.00–0.44)
Eosinophils Automated: 0 %
Hematocrit: 33.5 % — ABNORMAL LOW (ref 37.6–49.6)
Hgb: 11.4 g/dL — ABNORMAL LOW (ref 12.5–17.1)
Immature Granulocytes Absolute: 0.06 10*3/uL (ref 0.00–0.07)
Immature Granulocytes: 0.6 %
Lymphocytes Absolute Automated: 0.86 10*3/uL (ref 0.42–3.22)
Lymphocytes Automated: 8.2 %
MCH: 31.1 pg (ref 25.1–33.5)
MCHC: 34 g/dL (ref 31.5–35.8)
MCV: 91.5 fL (ref 78.0–96.0)
MPV: 9.7 fL (ref 8.9–12.5)
Monocytes Absolute Automated: 0.96 10*3/uL — ABNORMAL HIGH (ref 0.21–0.85)
Monocytes: 9.2 %
Neutrophils Absolute: 8.55 10*3/uL — ABNORMAL HIGH (ref 1.10–6.33)
Neutrophils: 81.8 %
Nucleated RBC: 0 /100 WBC (ref 0.0–0.0)
Platelets: 271 10*3/uL (ref 142–346)
RBC: 3.66 10*6/uL — ABNORMAL LOW (ref 4.20–5.90)
RDW: 14 % (ref 11–15)
WBC: 10.45 10*3/uL — ABNORMAL HIGH (ref 3.10–9.50)

## 2021-04-23 LAB — GFR: EGFR: >60

## 2021-04-23 LAB — MAGNESIUM: Magnesium: 1.6 mg/dL (ref 1.6–2.6)

## 2021-04-23 NOTE — Progress Notes (Addendum)
Ortho Progress Note    No acute events overnight.  Dressing intact.   Neurovascular exam unchanged.    A/P POD#1  1. PT/OT - WBAT to LLE. DVT Ppx. On heparin currently. Can be on ASA 81 BID from ortho standpoint or continue with current anticoagulation per medicine.  2. CM/SW for eventual dispo.   3. Will require follow up in 10-14 days from discharge. [phone].

## 2021-04-23 NOTE — PT Eval Note (Signed)
Physical Therapy Evaluation    Danny Sutton    Unit: [facility]  Bed: U981/X914-78      Post Acute Care Therapy Recommendations:   Discharge Recommendations:  SNF  If SNF  recommended discharge disposition is not available, patient will need to return to memory care, 24 hour assist for functional mobility and HHPT.   DME needs IF patient is discharging home: Front wheel walker, Wheelchair-manual, [facility], Hospital bed    Therapy discharge recommendations may change with patient status.  Please refer to most recent note for up-to-date recommendations.    Recommended (non-medical) mode of transportation at discharge:  Stretcher    PMP - Progressive Mobility Protocol   PMP Activity: Step 4 - Dangle at Bedside      Evaluation:   Consult received for Danny Sutton for PT evaluation and treatment.  Chart reviewed.  Patient's medical condition is appropriate for Physical Therapy intervention at this time.     Medical Diagnosis: Closed displaced intertrochanteric fracture of left femur, initial encounter [S72.142A]  Fall, initial encounter [W19.XXXA]  Cystitis [N30.90]  Severe dementia with other behavioral disturbance, unspecified dementia type [F03.C18]    Therapy Diagnosis: difficulty walking, generalized muscle weakness    Precautions  Weight Bearing Status: no restrictions  Other Precautions: Falls, Dementia        History of Present Illness: Danny Sutton is a 80 y.o. male admitted on 04/22/2021 with   Procedure(s):  OPEN REDUCTION, HIP, INSERTION IM NAIL (GAMMA)    1 Day Post-Op  -------------------         Patient Active Problem List   Diagnosis    Cognitive decline    Observation for suspected genetic or metabolic condition    Pre-procedure lab exam    Impaired mobility and activities of daily living    Closed displaced intertrochanteric fracture of left femur, initial encounter     Past Medical History:   Diagnosis Date    Alzheimer disease     Dementia     Heart murmur     Hypertension     Prostate cancer      Prostate cancer     Prostate cancer      Past Surgical History:   Procedure Laterality Date    PROSTATE SURGERY           X-Rays/Tests/Labs:  XR Hip Left 1 vw without pelvis    Result Date: 04/22/2021  1.  Postsurgical changes of recent cephalomedullary nail fixation for the intertrochanteric fracture of the left proximal femur. Alignment is improved from prior. No evidence of immediate hardware complication. Vassie Moment, MD  04/22/2021 9:14 PM    Femur Left AP and Lateral    Result Date: 04/22/2021  1.  Acute, slightly impacted and comminuted fracture of the left proximal femoral intertrochanteric region with mild to moderate coxa varus deformity. These critical results were discussed with and acknowledged by Secundino Ginger, MD on 04/22/2021 9:00 AM. Candi Leash, MD  04/22/2021 9:00 AM    CT Head without Contrast    Result Date: 04/22/2021   1.  No intracranial hemorrhage or acute intracranial findings. 2.  Parenchymal volume loss and chronic small vessel white matter changes, similar compared to prior. Brenton Grills, MD  04/22/2021 9:08 AM    XR Hip left 2-3 vw with Pelvis    Result Date: 04/22/2021  1.  Acute, slightly impacted and comminuted fracture of the left proximal femoral intertrochanteric region with mild to moderate coxa varus deformity. These critical results were  discussed with and acknowledged by Secundino Ginger, MD on 04/22/2021 9:00 AM. Candi Leash, MD  04/22/2021 9:00 AM    CT Cervical Spine without Contrast    Result Date: 04/22/2021   No acute fracture or traumatic malalignment. Brenton Grills, MD  04/22/2021 9:05 AM    Fluoroscopy less than 1 hour    Result Date: 04/22/2021   Fluoroscopic guidance provided without the presence of a radiologist. Prince Solian, MD  04/22/2021 7:46 PM    XR Chest  AP Portable    Result Date: 04/22/2021  No detectable active cardiopulmonary disease or traumatic abnormality in the chest. Wilmon Pali, MD  04/22/2021 8:47 AM               Previous Functional Level/Home Living  Situation  Prior Level of Function  Prior level of function: Up to chair with assistance  Baseline Activity Level: No independent activity      Home Living Arrangements  Living Arrangements: Other (Comment) ([facility])  Type of Home: Assisted living  Home Layout: One level        Subjective: Patient is agreeable to participation in the therapy session.   Pain Assessment  Pain Assessment: PAINAD           PAINAD Score: 6      Objective:  Patient is in bed with peripheral IV and male external catheter in place.    Observation of patient/vitals     Inspection/Posture  Inspection/Posture: WFL  Cognition/Neuro Status  Arousal/Alertness: Generalized responses  Attention Span: Difficulty attending to directions  Orientation Level: Oriented to person  Memory: Unable to assess  Following Commands: Does not follow commands  Safety Awareness: unable to assess  Insights: Not aware of deficits  Problem Solving: Unable to assess  Behavior: uncooperative;inattentive        Musculoskeletal Examination  Gross ROM  Right Sutton Extremity ROM: within functional limits  Left Sutton Extremity ROM:  (Decreased)                                                          Gross Strength  Right Sutton Extremity Strength: within functional limits  Left Sutton Extremity Strength: 2/5                Sensation: unable to assess      Functional Mobility  Rolling: Dependent  Supine to Sit: Dependent  Scooting to HOB: Dependent  Scooting to EOB: Dependent  Sit to Supine: Dependent         Balance  Sitting - Static:  (Max A x 1)  Sitting - Dynamic:  (Dependent)                 AM-PAC Basic Mobility  Turning Over in Bed: A lot  Sitting Down On/Standing From Armchair: Unable  Lying on Back to Sitting on Side of Bed: A lot  Assist Moving to/from Bed to Chair: Total  Assist to Walk in Hospital Room: Total  Assist to Climb 3-5 Steps with Railing: Total  PT Basic Mobility Raw Score: 8           Participation and Endurance  Participation  Effort: poor  Endurance: Endurance does not limit participation in activity     Assessment:    Assessment: Decreased LE ROM;Decreased LE  strength;Decreased safety/judgement during functional mobility;Decreased cognition;Decreased functional mobility;Decreased balance;Gait impairment      Patient History: (Low = none; Mod = 1-2; High = 3 or more)   Personal factors and pre-existing co-morbidities affecting plan of care include mentation/cognition and chronic illness(Dementia, HTN)    Examination: (Low = 1-2; Mod = 3 or more; High = 4 or more)   Plan of care will address impairments in the follow body systems: pain, strength, function, and gait as demonstrated by these objective measures: pain scale, manual muscle test, AM-PAC, and gait analysis    Clinical presentation is stable.  Plan of care complexity impacted by situation that requires coordination with other healthcare professionals for pain control and discharge needs        Prognosis: Fair;With continued PT status post acute discharge        Plan:       Patient Goal: none voiced     Risks/Benefits/POC Discussed with Pt/Family: With patient/family       Treatment/Interventions: Exercise;Gait training;Stair training;Neuromuscular re-education;Functional transfer training;LE strengthening/ROM;Endurance training;Cognitive reorientation;Patient/family training;Equipment eval/education;Bed mobility         Goals   Goals  Goal Formulation: With patient/family  Time for Goal Acheivement: 5 visits  Goals: Select goal  Pt Will Go Supine To Sit: with moderate assist  Pt Will Perform Sit to Stand: with moderate assist          Interdisciplinary Communication:   Pt left up in bed with alarm activated.  Updated white communication board in room with patient's current mobility status.  Communicated with Marcelino Duster, RN via FTF regarding pt's functional mobility status.    Spoke to patient's daughter Darl Pikes via telephone regarding PLOC, current function, and discharge  needs    Education:   Educated patient to role of physical therapy, importance of participation in PT,plan of care, transfer training techniques, falls prevention, home safety, next appropriate level of care.      Patient demonstrated poor understanding.      Discussed goals of therapy with patient's family, in agreement with plan.      Time Calculation  PT Received On: 04/23/21  Start Time: 0715  Stop Time: 0745  Time Calculation (min): 30 min          Signature:  Jacqulyn Cane, PT, DPT            Unit: [facility]  Bed: [phone]

## 2021-04-23 NOTE — Progress Notes (Signed)
Pt taken to Medical Rm 445 after calling report to Harriett Sine, Charity fundraiser. Taken with personal effects, and daughter who [place] at the bedside.

## 2021-04-23 NOTE — Progress Notes (Signed)
[facility]   HOSPITALIST  PROGRESS NOTE      Patient: Danny Sutton  Date: 04/23/2021   LOS: 1 Days  Admission Date: 04/22/2021   MRN: [id number]  Attending: Katy Apo MD     ASSESSMENT/PLAN     Kazimierz Springborn is a 80 y.o. male admitted with Left hip intertrochanteric femur fracture s/p mechanical fall.    1. Left hip inter-trochanteric femur fracture s/p mechanical fall s/p ORIF and IM nailing,POD # 1  PT/OT evaluation/treatment  IV analgesics PRN  DVT prophylaxis  Check H/H in AM     2. Advanced Alzheimer's dementia/Parkinson's disease  Resume home medications when he's able to eat  Supportive care  DNR/DNI code status(confirmed with patient's daughter)  Plan is to return to [facility] care unit at [facility] PT/OT arrangements    3. Pyuria  On IV Ceftriaxone  Follow urine CS     Analgesia: As needed    Nutrition: Regular Diet    Safety Checklist  DVT prophylaxis:  CHEST guideline (See page [id number]) Chemical   Foley: Not present   IVs:  Peripheral IV   PT/OT: Ordered   Daily CBC & or Chem ordered:  SHM/ABIM guidelines (see #5) Yes, due to clinical and lab instability     MD/RN rounds: Yes       Code Status: do not resuscitate    DISPO:  Memory care unit at the [facility]    Family Contact: Refer to the chart    Care Plan discussed with nursing, consultants, case manager.       SUBJECTIVE     Michel Hendon is mostly non verbal,confused,resting in bed,NAD  MEDICATIONS     Current Facility-Administered Medications   Medication Dose Route Frequency    acetaminophen  1,000 mg Oral Q8H SCH    calcium citrate-vitamin D  2 tablet Oral Daily    carbidopa-levodopa  1.5 tablet Oral TID    ceFAZolin  1 g Intravenous Q8H    cefTRIAXone  1 g Intravenous Q24H    heparin (porcine)  5,000 Units Subcutaneous Q12H [facility]    senna-docusate  2 tablet Oral BID    sertraline  100 mg Oral Daily    vitamins/minerals  1 tablet Oral Daily       ROS     Remainder of 10 point ROS as above or otherwise negative    PHYSICAL EXAM      Vitals:    04/23/21 0728   BP: 102/57   Pulse: 74   Resp:    Temp: 98.1 F (36.7 C)   SpO2: 96%       Temperature: Temp  Min: 97.7 F (36.5 C)  Max: 99.9 F (37.7 C)  Pulse: Pulse  Min: 68  Max: 99  Respiratory: Resp  Min: 16  Max: 22  Non-Invasive BP: BP  Min: 102/57  Max: 155/68  Pulse Oximetry SpO2  Min: 94 %  Max: 100 %    Intake and Output Summary (Last 24 hours) at Date Time    Intake/Output Summary (Last 24 hours) at 04/23/2021 0829  Last data filed at 04/23/2021 9528  Gross per 24 hour   Intake 712 ml   Output 500 ml   Net 212 ml       GEN APPEARANCE: Awake but non-verbal,confused,NAD  HEENT: PERL  NECK: Supple; No bruits  CVS: RRR, S1, S2; No M/G/R  LUNGS: CTAB; No Wheezes; No Rhonchi: No rales  ABD: Soft; No TTP; + Normoactive BS  EXT: Left hip surgical dressing is c,d,I  Pulses 2+ and intact,capillary refill normal  SKIN: No other rash or Lesions  NEURO: Non focal exam     LABS     Recent Labs   Lab 04/23/21  0440 04/22/21  0742   WBC 10.45* 6.01   RBC 3.66* 4.35   Hgb 11.4* 13.5   Hematocrit 33.5* 39.2   MCV 91.5 90.1   Platelets 271 283       Recent Labs   Lab 04/23/21  0440 04/22/21  0742   Sodium 141 138   Potassium 4.3 3.9   Chloride 107 107   CO2 22 20   BUN 14.0 17.0   Creatinine 1.0 0.9   Glucose 183* 118*   Calcium 8.5 9.2   Magnesium 1.6  --        Recent Labs   Lab 04/23/21  0440   ALT 14   AST (SGOT) 21   Bilirubin, Total 0.7   Albumin 3.1*   Alkaline Phosphatase 114             Recent Labs   Lab 04/22/21  1238   PT INR 1.0   PT 12.0       Microbiology Results (last 15 days)       Procedure Component Value Units Date/Time    COVID-19 (SARS-CoV-2) only ([facility]) asymptomatic admission - Hospitals [[id number]] Collected: 04/22/21 1015    Order Status: Completed Specimen: Nasopharyngeal Updated: 04/22/21 1105     Purpose of COVID testing Screening     SARS-CoV-2 Specimen Source Nasal Swab     SARS CoV 2 Overall Result Not Detected     Comment:  __________________________________________________  -A result of "Detected" indicates POSITIVE for the    presence of SARS CoV-2 RNA  -A result of "Not Detected" indicates NEGATIVE for the    presence of SARS CoV-2 RNA  __________________________________________________________  Test performed using the Roche cobas [facility]iat SARS-CoV-2 assay. This assay is  only for use under the Food and Drug Administrations Emergency Use  Authorization. This is a real-time RT-PCR assay for the qualitative  detection of SARS-CoV-2 RNA. Viral nucleic acids may persist in vivo,  independent of viability. Detection of viral nucleic acid does not imply the  presence of infectious virus, or that virus nucleic acid is the cause of  clinical symptoms. Negative results do not preclude SARS-CoV-2 infection and  should not be used as the sole basis for diagnosis, treatment or other  patient management decisions. Negative results must be combined with  clinical observations, patient history, and/or epidemiological information.  Invalid results may be due to inhibiting substances in the specimen and  recollection should occur. Please see Fact Sheets for patients and providers  located:  WirelessDSLBlog.no         Narrative:      o Collect and clearly label specimen type:  o PREFERRED-Upper respiratory specimen: One Nasal Swab in  Transport Media.  o Hand deliver to laboratory ASAP  Indication for testing->Extended care facility admission to  semi private room  Screening    Urine culture [[id number]] Collected: 04/22/21 0757    Order Status: No result Specimen: Urine Updated: 04/22/21 0815             RADIOLOGY     Radiological Procedure personally reviewed and concur with radiologist reports unless stated otherwise.    XR Hip Left 1 vw without pelvis   Final Result  1.  Postsurgical changes of recent cephalomedullary nail fixation for   the intertrochanteric fracture of the left proximal femur. Alignment is    improved from prior. No evidence of immediate hardware complication.       Vassie Moment, MD    04/22/2021 9:14 PM      Fluoroscopy less than 1 hour   Final Result       Fluoroscopic guidance provided without the presence of a radiologist.       Prince Solian, MD    04/22/2021 7:46 PM      CT Head without Contrast   Final Result       1.  No intracranial hemorrhage or acute intracranial findings.   2.  Parenchymal volume loss and chronic small vessel white matter   changes, similar compared to prior.      Brenton Grills, MD    04/22/2021 9:08 AM      CT Cervical Spine without Contrast   Final Result       No acute fracture or traumatic malalignment.      Brenton Grills, MD    04/22/2021 9:05 AM      XR Chest  AP Portable   Final Result      No detectable active cardiopulmonary disease or traumatic abnormality in   the chest.      Wilmon Pali, MD    04/22/2021 8:47 AM      XR Hip left 2-3 vw with Pelvis   Final Result      1.  Acute, slightly impacted and comminuted fracture of the left   proximal femoral intertrochanteric region with mild to moderate coxa   varus deformity.      These critical results were discussed with and acknowledged by   Secundino Ginger, MD on 04/22/2021 9:00 AM.      Candi Leash, MD    04/22/2021 9:00 AM      Femur Left AP and Lateral   Final Result      1.  Acute, slightly impacted and comminuted fracture of the left   proximal femoral intertrochanteric region with mild to moderate coxa   varus deformity.      These critical results were discussed with and acknowledged by   Secundino Ginger, MD on 04/22/2021 9:00 AM.      Candi Leash, MD    04/22/2021 9:00 AM          Signed,  Katy Apo, MD  8:29 AM 04/23/2021

## 2021-04-23 NOTE — OT Eval Note (Signed)
Occupational Therapy Eval - Clarnce Flock  Patient: Danny Sutton    Bed: W295/A213-08    Post Acute Care Therapy Recommendations:   Discharge Recommendation: SNF    If above recommendation is not available pt will need 24 hour assist, memory care unit with HHOT/PT, for safe discharge.    DME Recommended for Discharge: Front wheel walker (daughter plans to purchase bed rails) , manual w/c, BSC, hospital bed IF discharging home.    Therapy discharge recommendation may change with patient status. Please refer to most recent note  for updated recommendations.    Evaluation:   Time Calculation  OT Received On: 04/23/21  Start Time: 1340  Stop Time: 1402  Time Calculation (min): 22 min    Precautions and Contraindications:   Precautions  Weight Bearing Status: no restrictions (WBAT)  Other Precautions: Falls, Dementia    Medical Diagnosis: Closed displaced intertrochanteric fracture of left femur, initial encounter [S72.142A]  Fall, initial encounter [W19.XXXA]  Cystitis [N30.90]  Severe dementia with other behavioral disturbance, unspecified dementia type [F03.C18]    Rehab Diagnosis: generalized muscle weakness, decreased ADLs/mobility      History of Present Illness: Amel Gianino is a 80 y.o. male admitted on 04/22/2021 L hip intertrochanteric femur fx s/p fall. Underwent   OPEN REDUCTION, HIP, INSERTION IM NAIL (GAMMA)    Patient Active Problem List   Diagnosis    Cognitive decline    Observation for suspected genetic or metabolic condition    Pre-procedure lab exam    Impaired mobility and activities of daily living    Closed displaced intertrochanteric fracture of left femur, initial encounter      Past Medical History:   Diagnosis Date    Alzheimer disease     Dementia     Heart murmur     Hypertension     Prostate cancer     Prostate cancer     Prostate cancer      Past Surgical History:   Procedure Laterality Date    PROSTATE SURGERY         Prior Level of Function/Social History:  Prior Level of Function- pt  able to verbalize simple one word responses "ok" "yes" at baseline  Prior level of function: Up to chair with assistance;Needs assistance with ADLs (Ambulates HHA per daughter)  Baseline Activity Level: Household ambulation (Short distance in facility with assist)  Driving: does not drive  Dressing - Upper Body: maximal assist  Dressing - Lower Body: dependent  Cooking: No  Feeding: moderate assist (able to feed self some finger foods, unable to use utensils)  Bathing: dependent  Grooming: maximal assist;dependent  Toileting: dependent (incontinent)  Employment: Retired  DME Currently at Home: ADL- Grab Bars;ADL- Paediatric nurse (24 hour supervision/memory care)  Home Living Arrangements  Living Arrangements: Other (Comment) ([facility] [employer] memory care)  Type of Home: Assisted living  Home Layout: One level  Bathroom Shower/Tub: Pension scheme manager: Raised  Bathroom Equipment:  (assume assessible with grab bars)  DME Currently at Home: ADL- Grab Bars;ADL- Paediatric nurse (24 hour supervision/memory care)  Home Living - Notes / Comments: Pt requires assist for all ADLS    Subjective:   Family and/or guardian are agreeable to patient's participation in the therapy session. Nursing clears patient for therapy.      Pain Assessment  Pain Assessment: FLACC       Objective:  Inspection/Posture: [facility]    Patient is in bed with SCD's and peripheral IV in  place. Comptroller and daughter at bedside    Current Level of Function:  Cognitive Status and Neuro Exam:  Cognition/Neuro Status  Arousal/Alertness: Localized responses  Attention Span: Unable to assess  Orientation Level: Disoriented X4  Memory:  (dementia)  Following Commands:  (not following commands this session, drowsy/sleeping).  No verbalization this session  Behavior:  (resistive, drowsy, eyes closed)  Coordination: FMC impaired;GMC impaired  Hand Dominance: right handed    FLACC (Face, Legs, Activity, Crying, Consolability)  Pain Rating: FLACC (rest) - Face:  no particular expression or smile  Pain Rating: FLACC (rest) - Legs: uneasy, restless, tense  Pain Rating: FLACC (rest) - Activity: squirming, shifting back and forth, tense (tense)  Pain Rating: FLACC (rest) - Cry: no cry (awake or asleep)  Pain Rating: FLACC (rest) - Consolability: reassured by occasional touch, hug or being talked to  Score: FLACC (rest): 3  Pain Rating: FLACC (activity) - Face: occasional grimace or frown, withdrawn, disinterested  Pain Rating: FLACC (activity) - Legs: uneasy, restless, tense  Pain Rating: FLACC (activity): squirming, shifting back and forth, tense  Pain Rating: FLACC (activity) - Cry: no cry (awake or asleep)  Pain Rating: FLACC (activity) - Consolability: reassured by occasional touch, hug or being talked to  Score: FLACC (activity): 4    Musculoskeletal Examination  Gross ROM  Right Upper Extremity ROM: needs focused assessment  Right Upper Extremity Overall ROM % reduced: reduced by 25% (PROM, resistive)  Left Upper Extremity ROM: needs focused assessment  Left Upper Extremity Overall ROM % reduced: reduced by 25% (PROM, resistive)  Gross Strength  Right Upper Extremity Strength:  (overall strength at least 4/5, resistive)  Left Upper Extremity Strength:  (overall strength is at least 4/5, resistive)     Tone  Tone: needs focused assessment (increased tightness/resistive)    Sensory/Oculomotor Examination  Sensory  Tactile - Light Touch:  (unable to assess due to cognition)  Visual Acuity:  (wore glasses in past, does not wear now)       Activities of Daily Living  Self-care and Home Management  Eating: Dependent (daughter fed pt. Pt able to eat some finger foods at baseline)  Grooming: Dependent  UB Dressing: Dependent  LB Dressing: Dependent;Don/doff L sock;Don/doff R sock  Toileting: Dependent (per daughter at baseline, incontinent)  Functional Transfers: other (Comment) (unable to assess, pt very drowsy/sleepy and difficult to participtate resistive, does better late am  per daugter)    Functional Mobility:  Mobility and Transfers  Rolling: Dependent  Supine to Sit: Unable to assess (Comment) (difficulty waking, resistive to all movement and ROM)  PMP - Progressive Mobility Protocol   PMP Activity: Step 2 - Supine Exercises       Assessment:   Patient History -Expanded chart review required including reading through labs, imaging, vitals, and speaking with patient's family for thorough history.      Examination - Examination reveals impairments in the following body systems: Assessment: decreased ROM;decreased strength;balance deficits;decreased independence with ADLs;decreased safety awareness;decreased cognition;decreased attention.      Clinical Decision Making -  Comprehensive assessment(s) required and presence of multiple co-morbidities or other factors that affect plan of care, requiring significant modification of tasks including falls, dementia, pain and recent sx     Prognosis: Fair;With continued OT s/p acute discharge;With family;24 hour supervision recommended    Plan:   Treatment Interventions: ADL retraining;Functional transfer training;UE strengthening/ROM;Cognitive reorientation;Patient/Family training;Equipment eval/education;Compensatory technique education     OT Frequency Recommended: 3-4x/wk     Goals:  Patient Goal: "To get him up and walking again and keep him safe so he doesn't fall"  Goals  Goal Formulation: Family (pt unable to participate in goal planning)  Time For Goal Achievement: 5 visits  Goals: Select goal  Patient will feed self: Moderate Assist;5 visits (finger foods)  Patient will groom self: 5 visits;Moderate Assist (to wash face)  Pt will transfer bed to [facility]: Maximal Assist;5 visits  Pt will sit at edge of bed: Stand by Assist;for 5 minutes;to prepare for OOB tasks;5 visits  Pt will perform Home Exercise Program: maximal assist;with caregiver/family assist;to increase engagement in ADLs;5 visits    Education:   Educated daughter to role of  occupational therapy, plan of care, home safety, next appropriate level of care, safety with mobility and ADLs, home safety.  Demonstrated good understanding. Also discussed goals of therapy and are in agreement with plan.      Interdisciplinary Communication:   Patient is in bed with sitter present and  bed/chair alarm set, and call bell within reach. Communicated with nursing  regarding OT POC.    Signature: Daria Pastures, OT

## 2021-04-23 NOTE — Progress Notes (Signed)
Patient disoriented x4, non verbal and does not follow any commands. Vss, medicated for pain and appears more comfortable. Left hip dressing cdi. Patient repositioned q2 hours. Nonblanchable redness noted to sacrum. WOCN consulted and mepilex in place for protection. Sitter at bedside to keep patient safe. Continue with plan of care.

## 2021-04-23 NOTE — Nursing Progress Note (Addendum)
Pt transferred from surgical to medical #[id number] at 1610. POD #1 L hip ORIF. Daughter and PSA at bedside. Bed mobility at this time, will work with PT again tomorrow. Regular diet, softer foods preferred. Meds crushed in apple sauce/pudding. Feeder/total care. Primofit in place. Scheduled tylenol and prn oxycodone or morphine for pain. High fall risk precautions continued.

## 2021-04-23 NOTE — Plan of Care (Signed)
AOX0, VSS, no response to any neuro questions, hip incision CDI, resting comfortably without any signs of pain. Pills given crushed with apple sauce, helped to eat.  Problem: Moderate/High Fall Risk Score >5  Goal: Patient will remain free of falls  Outcome: Progressing     Problem: Safety  Goal: Patient will be free from injury during hospitalization  Outcome: Progressing  Goal: Patient will be free from infection during hospitalization  Outcome: Progressing     Problem: Pain  Goal: Pain at adequate level as identified by patient  Outcome: Progressing     Problem: Side Effects from Pain Analgesia  Goal: Patient will experience minimal side effects of analgesic therapy  Outcome: Progressing     Problem: Discharge Barriers  Goal: Patient will be discharged home or other facility with appropriate resources  Outcome: Progressing     Problem: Psychosocial and Spiritual Needs  Goal: Demonstrates ability to cope with hospitalization/illness  Outcome: Progressing     Problem: Compromised Tissue integrity  Goal: Damaged tissue is healing and protected  Outcome: Progressing  Goal: Nutritional status is improving  Outcome: Progressing     Problem: Hip Surgery  Goal: Free from Infection  Outcome: Progressing  Goal: Nutritional Intake is Adequate  Outcome: Progressing  Goal: Neurovascular Status is Stable  Outcome: Progressing  Goal: Hemodynamic Stability  Outcome: Progressing  Goal: Mobility/activity is maintained at optimum level for patient  Outcome: Progressing  Goal: Pain at adequate level as identified by patient  Outcome: Progressing  Goal: Address patient self-management plan  Outcome: Progressing  Goal: Patient/Patient Care Companion demonstrates understanding of disease process, treatment plan, medications, and discharge plan  Outcome: Progressing

## 2021-04-23 NOTE — UM Notes (Signed)
This clinical review is based on/compiled from documentation provided by the treatment team within the patients medical record.    Raymond Gurney, RN, BSN  Clinical Case Manager  [facility]   [address]  [address]  [place], [place] [address]  NPI: [id number]  Tax ID: [id number]  Phone: [phone]  Fax: [phone]        PATIENT NAME: Danny Sutton,Danny Sutton  DOB: [birth year]  PMH:   Past Medical History:   Diagnosis    Alzheimer disease    Dementia    Heart murmur    Hypertension    Prostate cancer    Prostate cancer    Prostate cancer     ADMITTED ON: 04/22/2021 80 y.o. male with dementia, original EMS report radioed before patient arrived apparently was that he fell downstairs and had a nosebleed, but neither of these is the case it seems.     His caregiver was present and witnessed that he lost his balance and had a ground-level fall, unclear whether he hit his head or not, has a left arm skin tear and seems to have left leg injury.  He cannot provide any history because he has severe dementia.  There is a DNR form with his paperwork.         ED Triage Vitals   Enc Vitals Group      BP 04/22/21 0714 138/71      Heart Rate 04/22/21 0714 74      Resp Rate 04/22/21 0714 19      Temp 04/22/21 0714 97.9 F (36.6 C)      Temp Source 04/22/21 0714 Axillary      SpO2 04/22/21 0714 98 %      Weight 04/22/21 0714 73.4 kg (161 lb 13.1 oz)       ADMISSION DIAGNOSIS:     04/22/21 0959  Adult Admit to Inpatient  Once        Diagnosis: Closed Displaced Intertrochanteric Fracture Of Left Femur, Initial Encounter    Level of Care: Acute    Patient Class: Inpatient       References:    [facility] Bed Placement Criteria    [facility] Bed Placement Criteria    [facility] Bed Placement Criteria    [facility] Bed Placement Criteria    [facility] Bed Placement Criteria   Question Answer Comment   Admitting Physician Katy Apo    Service: Orthopedics    Estimated Length of Stay > or = to 2 midnights    Tentative Discharge Plan? Home or Self  Care    Does patient need telemetry? No    Special Needs: AMS    Special Needs: Fall Risk                04/22/2021  NOTES:     [employer] is a 80 y.o. male admitted under INPATIENT with Left hip intertrochanteric femur fracture s/p mechanical fall.     1. Left hip inter-trochanteric femur fracture s/p mechanical fall  Keep NPO for surgical intervention  Orthopedic consult.Plan for Left hip ORIF with IM nailing today.D/W Dr Amalia Greenhouse  Patient is medically stable and cleared for above procedure  PT/OT evaluation/treatment post-op(This might be challenging as patient is unable to follow simple commands due to advanced dementia)  IV analgesics PRN  DVT prophylaxis with SC Heparin  Check H/H in AM     2. Advanced Alzheimer's dementia/Parkinson's disease  Resume home medications when he's able to eat  Supportive care  DNR/DNI code status(confirmed with patient's daughter)  Plan is to return [facility] care unit at [facility] PT/OT arrangements     Nutrition  NPO for surgery     DVT/VTE Prophylaxis  Heparin 5,000 units TIDq8        Operative Procedure:   Procedure(s):  OPEN REDUCTION, HIP, INSERTION IM NAIL (GAMMA)     Preoperative Diagnosis:   Pre-Op Diagnosis Codes:     * Closed fracture of left hip, initial encounter [S72.002A]     Postoperative Diagnosis:   Post-Op Diagnosis Codes:     * Closed fracture of left hip, initial encounter [S72.002A]         MEDS:   Ancef 2g IV  Rocephin 1g IV  Morphine 4mg  IVx2  Zofran 4mg  IV    D5 1/2 NaCl 184ml/hr IV continuous      PRN:  Fentanyl IV x3  Morphine 2mg  IV    IMAGING:    XR Hip Left 1 vw without pelvis    Result Date: 04/22/2021  1.  Postsurgical changes of recent cephalomedullary nail fixation for the intertrochanteric fracture of the left proximal femur. Alignment is improved from prior. No evidence of immediate hardware complication. Vassie Moment, MD  04/22/2021 9:14 PM    Femur Left AP and Lateral    Result Date: 04/22/2021  1.  Acute,  slightly impacted and comminuted fracture of the left proximal femoral intertrochanteric region with mild to moderate coxa varus deformity. These critical results were discussed with and acknowledged by Secundino Ginger, MD on 04/22/2021 9:00 AM. Candi Leash, MD  04/22/2021 9:00 AM      XR Hip left 2-3 vw with Pelvis    Result Date: 04/22/2021  1.  Acute, slightly impacted and comminuted fracture of the left proximal femoral intertrochanteric region with mild to moderate coxa varus deformity. These critical results were discussed with and acknowledged by Secundino Ginger, MD on 04/22/2021 9:00 AM. Candi Leash, MD  04/22/2021 9:00 AM        04/23/21- Patient remains on surgical unit, POD 1    Vitals:    04/22/21 2050 04/22/21 2316 04/23/21 0426 04/23/21 0728   BP: 121/76 116/65 104/63 102/57   Pulse: 99 96 96 74   Resp: 18 18 18     Temp:  98.9 F (37.2 C) 98.4 F (36.9 C) 98.1 F (36.7 C)   TempSrc:  Axillary Axillary Oral   SpO2: 95% 96% 96% 96%   Weight:

## 2021-04-24 LAB — CBC AND DIFFERENTIAL
Absolute NRBC: 0 10*3/uL (ref 0.00–0.00)
Basophils Absolute Automated: 0.03 10*3/uL (ref 0.00–0.08)
Basophils Automated: 0.4 %
Eosinophils Absolute Automated: 0.05 10*3/uL (ref 0.00–0.44)
Eosinophils Automated: 0.7 %
Hematocrit: 29.7 % — ABNORMAL LOW (ref 37.6–49.6)
Hgb: 10 g/dL — ABNORMAL LOW (ref 12.5–17.1)
Immature Granulocytes Absolute: 0.04 10*3/uL (ref 0.00–0.07)
Immature Granulocytes: 0.5 %
Lymphocytes Absolute Automated: 1.44 10*3/uL (ref 0.42–3.22)
Lymphocytes Automated: 19.3 %
MCH: 31.1 pg (ref 25.1–33.5)
MCHC: 33.7 g/dL (ref 31.5–35.8)
MCV: 92.2 fL (ref 78.0–96.0)
MPV: 9.8 fL (ref 8.9–12.5)
Monocytes Absolute Automated: 0.93 10*3/uL — ABNORMAL HIGH (ref 0.21–0.85)
Monocytes: 12.5 %
Neutrophils Absolute: 4.96 10*3/uL (ref 1.10–6.33)
Neutrophils: 66.6 %
Nucleated RBC: 0 /100 WBC (ref 0.0–0.0)
Platelets: 222 10*3/uL (ref 142–346)
RBC: 3.22 10*6/uL — ABNORMAL LOW (ref 4.20–5.90)
RDW: 14 % (ref 11–15)
WBC: 7.45 10*3/uL (ref 3.10–9.50)

## 2021-04-24 LAB — BASIC METABOLIC PANEL
Anion Gap: 8 (ref 5.0–15.0)
BUN: 13 mg/dL (ref 9.0–28.0)
CO2: 25 mEq/L (ref 17–29)
Calcium: 8.2 mg/dL (ref 7.9–10.2)
Chloride: 107 mEq/L (ref 99–111)
Creatinine: 0.8 mg/dL (ref 0.5–1.5)
Glucose: 116 mg/dL — ABNORMAL HIGH (ref 70–100)
Potassium: 3.9 mEq/L (ref 3.5–5.3)
Sodium: 140 mEq/L (ref 135–145)

## 2021-04-24 LAB — GFR: EGFR: >60

## 2021-04-24 NOTE — PT Progress Note (Signed)
Physical Therapy Treatment   Isaac Dubie    Unit: 4[facility] MED[facility]  Bed: W119/J478-29      Post Acute Care Therapy Recommendations:     If SNF  recommended discharge disposition is not available, patient will need to return to memory care, 24 hour assist for functional mobility and HHPT.   DME needs IF patient is discharging home: Front wheel walker, Wheelchair-manual, [facility], Hospital bed    Therapy discharge recommendations may change with patient status.  Please refer to most recent note for up-to-date recommendations.    Recommended (non-medical) mode of transportation at discharge:   stretcher    PMP - Progressive Mobility Protocol   PMP Activity: Step 4 - Dangle at Bedside    Assessment:      Increased pain noted during session as pt grimacing and gripping L LE, although remained leaning to L in sitting. Pt with decreased command following. Continued therapy required to address acute mobility changes as tolerated.        Prognosis: Fair    Interdisciplinary Communication:   Patient up in bed with alarm activated; call bell within reach. Communicated with RN, via direct contact regarding patient's mobility status.    Education:   Further education provided to patient on safety with mobility and falls prevention.  Pt demonstrated poor understanding. Will benefit from further training to focus on bed mobility, out of bed activity, transfers, gait and exs.     Plan:   Goals  Pt Will Go Supine To Sit: with moderate assist;Not met  Pt Will Perform Sit to Stand: with moderate assist;Not met      Plan  Risks/Benefits/POC Discussed with Pt/Family: With patient/family  Patient Goal: none voiced  Treatment/Interventions: Exercise;Gait training;Stair training;Neuromuscular re-education;Functional transfer training;LE strengthening/ROM;Endurance training;Cognitive reorientation;Patient/family training;Equipment eval/education;Bed mobility  PT Frequency: 3-4x/wk    Continue plan of  care.    ------------------------------------------------------------------------------------------------------------------    Time of treatment:   Start Time: 1334 Stop Time: 1359  Time Calculation (min): 25 min  PT Received On: 04/24/21      Treatment # 2      Precautions  Weight Bearing Status: no restrictions  Other Precautions: Falls, Dementia          Patient's medical condition is appropriate for Physical Therapy intervention at this time.     Subjective: "Ow"  Nursing clears patient for therapy.  Patient Goal: none voiced               PAINAD  Breathing, Independent of vocalization: Occasional labored breathing, short period of hyperventilation  Negative Vocalization: Occasional moan or groan, low-level speech with negative or disapproving quality  Facial Expression: Facial grimacing  Body Language: Rigid, fists clenched knees pulled up, pulling or pushing away, striking out  Consolability: Distracted or rassured by voice or touch  PAINAD Score: 7        Objective:  Patient is in bed with daughter and sitter in room. BP cuff, pulse oximeter, in place.    Blood pressure 92/53, pulse 82, temperature 98.2 F (36.8 C), temperature source Axillary, resp. rate 20, weight 73.4 kg (161 lb 13.1 oz), SpO2 96 %.    Lab Results   Component Value Date    WBC 7.45 04/24/2021    HGB 10.0 (L) 04/24/2021    HCT 29.7 (L) 04/24/2021    MCV 92.2 04/24/2021    PLT 222 04/24/2021         Cognition/Neuro Status  Arousal/Alertness: Generalized responses  Attention Span: Difficulty attending to  directions  Orientation Level: Unable to assess  Memory: Unable to assess  Following Commands: Does not follow commands  Safety Awareness: unable to assess  Insights: Not aware of deficits  Problem Solving: Unable to assess  Behavior: anxious;inattentive    Functional Mobility  Rolling: Dependent  Supine to Sit: Dependent;Minimal Assist (dependent for LE's off bed and pivoting hips, but was able to bring trunk into upright with min  A)  Scooting to HOB: Dependent  Scooting to EOB: Dependent  Sit to Supine: Dependent         Neuro Re-Ed  Sitting Balance:  (CGA)        Treatment Activities:  Supine < >sit, sitting balance for ~4-5 minutes        Signature: Christi-Anne Hisaw, PT  04/24/2021          Room/Bed:  U440/H474-25

## 2021-04-24 NOTE — Progress Notes (Signed)
No apparent complications following anesthesia.  Sitter and daughter at bedside.  Daughter states she thinks pain is controlled.  Was due for pain medication shortly after interview.

## 2021-04-24 NOTE — Plan of Care (Signed)
Disoriented x4, soft BP on RA. Pain assessed using PAINAD, no acute distress noted during pt encounters/assessments. Dressing on Left hip remained C/D/I with no drainage noted. Pt tolerated scheduled meds well crushed in apple sauce. Feeder/total care. No BM in PM shift. Sitter @ bedside for safety. Safety measures in place, bed locked @ lowest position, 3/4 side-rails up, bedside table and call-bell within reach, and alarm activated.     Problem: Hip Surgery  Goal: Free from Infection  Outcome: Progressing  Flowsheets (Taken 04/24/2021 0437)  Free from infection:   Monitor/assess vital signs   Assess surgical dressing, reinforce or change as needed per order   Teach/reinforce use of incentive spirometer 10 times per hour while awake, cough and deep breath as needed   Foley maintenance care if Foley in place. Utilize indwelling urinary catheter policy/protocol ([place]).   Administer and discontinue antibiotics per SCIP measures   Maintain temperature within desired parameters   Assess for signs and symptoms of infection  Goal: Nutritional Intake is Adequate  Outcome: Progressing  Goal: Neurovascular Status is Stable  Outcome: Progressing  Goal: Hemodynamic Stability  Outcome: Progressing  Goal: Mobility/activity is maintained at optimum level for patient  Outcome: Progressing  Goal: Pain at adequate level as identified by patient  Outcome: Progressing  Goal: Address patient self-management plan  Outcome: Progressing  Goal: Patient/Patient Care Companion demonstrates understanding of disease process, treatment plan, medications, and discharge plan  Outcome: Progressing     Problem: Safety  Goal: Patient will be free from injury during hospitalization  Outcome: Progressing  Flowsheets (Taken 04/22/2021 1514 by Mardella Layman, RN)  Patient will be free from injury during hospitalization:   Assess patient's risk for falls and implement fall prevention plan of care per policy   Provide and maintain safe environment   Ensure  appropriate safety devices are available at the bedside   [place]ourly rounding   Use appropriate transfer methods   Include patient/ family/ care giver in decisions related to safety  Goal: Patient will be free from infection during hospitalization  Outcome: Progressing

## 2021-04-24 NOTE — Nursing Progress Note (Signed)
Worked with PT. PRN oxycodone for pain to L hip. Continuous fluids discontinued. Tolerating regular diet and Ensure High Protein well-- see flowsheet for %s. External catheter in place for urinary incontinence; no BM this shift. Q2h repositioning and high fall risk precautions continued.

## 2021-04-24 NOTE — Progress Notes (Signed)
[facility]  HOSPITALIST  PROGRESS NOTE      Patient: Danny Sutton  Date: 04/24/2021   LOS: 2 Days  Admission Date: 04/22/2021   MRN: [id number]  Attending: Katy Apo MD     ASSESSMENT/PLAN     Hermes Wafer is a 80 y.o. male admitted with Left hip intertrochanteric femur fracture s/p mechanical fall.    1. Left hip inter-trochanteric femur fracture s/p mechanical fall s/p ORIF and IM nailing,POD # 2  PT/OT evaluation/treatment recommends SNF.Patient's daughter prefers rehab at [facility] care unit if it's possible.Case management to assist with disposition.  Analgesics PRN  Heparin SC for DVT prophylaxis,change to ASA 81 mg PO BID at discharge  H/H stable     2. Advanced Alzheimer's dementia/Parkinson's disease  Patient not taking Sinemet anymore per his daughter.Discontinued Sinemet.  DNR/DNI code status(confirmed with patient's daughter)  Plan is to return to [facility] [facility] care unit at discharge,if PT/OT arrangement can be made at that facility    3. Pyuria  Urine CS- no significant growth,Discontinued IV Ceftriaxone.     Analgesia: As needed    Nutrition: Regular Diet    Safety Checklist  DVT prophylaxis:  CHEST guideline (See page [id number]) Chemical   Foley: Not present   IVs:  Peripheral IV   PT/OT: Ordered   Daily CBC & or Chem ordered:  SHM/ABIM guidelines (see #5) Yes, due to clinical and lab instability     MD/RN rounds: Yes       Code Status: do not resuscitate    DISPO:  Memory care unit at the [facility]    Family Contact: Refer to the chart    Care Plan discussed with nursing, consultants, case manager,daughter at bedside       SUBJECTIVE     Teressa Lower is non verbal,confused,resting in bed,NAD  MEDICATIONS     Current Facility-Administered Medications   Medication Dose Route Frequency    acetaminophen  1,000 mg Oral Q8H SCH    calcium citrate-vitamin D  2 tablet Oral Daily    carbidopa-levodopa  1.5 tablet Oral TID    cefTRIAXone  1 g Intravenous Q24H    heparin (porcine)   5,000 Units Subcutaneous Q12H [facility]    senna-docusate  2 tablet Oral BID    sertraline  100 mg Oral Daily    vitamins/minerals  1 tablet Oral Daily       ROS     Remainder of 10 point ROS as above or otherwise negative    PHYSICAL EXAM     Vitals:    04/24/21 0810   BP: 112/69   Pulse: 70   Resp: 16   Temp: 98.2 F (36.8 C)   SpO2: 95%       Temperature: Temp  Min: 97.3 F (36.3 C)  Max: 99.7 F (37.6 C)  Pulse: Pulse  Min: 65  Max: 81  Respiratory: Resp  Min: 16  Max: 18  Non-Invasive BP: BP  Min: 99/51  Max: 114/71  Pulse Oximetry SpO2  Min: 93 %  Max: 100 %    Intake and Output Summary (Last 24 hours) at Date Time    Intake/Output Summary (Last 24 hours) at 04/24/2021 0843  Last data filed at 04/24/2021 0700  Gross per 24 hour   Intake 100 ml   Output 650 ml   Net -550 ml         GEN APPEARANCE: Awake but non-verbal,confused,NAD  HEENT: PERL  NECK: Supple; No bruits  CVS: RRR, S1, S2; No M/G/R  LUNGS: CTAB; No Wheezes; No Rhonchi: No rales  ABD: Soft; No TTP; + Normoactive BS  EXT: Left hip surgical dressing is c,d,I  Pulses 2+ and intact,capillary refill normal  SKIN: No other rash or Lesions  NEURO: Non focal exam     LABS     Recent Labs   Lab 04/24/21  0527 04/23/21  0440 04/22/21  0742   WBC 7.45 10.45* 6.01   RBC 3.22* 3.66* 4.35   Hgb 10.0* 11.4* 13.5   Hematocrit 29.7* 33.5* 39.2   MCV 92.2 91.5 90.1   Platelets 222 271 283         Recent Labs   Lab 04/24/21  0527 04/23/21  0440 04/22/21  0742   Sodium 140 141 138   Potassium 3.9 4.3 3.9   Chloride 107 107 107   CO2 25 22 20    BUN 13.0 14.0 17.0   Creatinine 0.8 1.0 0.9   Glucose 116* 183* 118*   Calcium 8.2 8.5 9.2   Magnesium  --  1.6  --          Recent Labs   Lab 04/23/21  0440   ALT 14   AST (SGOT) 21   Bilirubin, Total 0.7   Albumin 3.1*   Alkaline Phosphatase 114               Recent Labs   Lab 04/22/21  1238   PT INR 1.0   PT 12.0         Microbiology Results (last 15 days)       Procedure Component Value Units Date/Time    COVID-19 (SARS-CoV-2)  only (Liat Rapid) asymptomatic admission - Hospitals [[id number]] Collected: 04/22/21 1015    Order Status: Completed Specimen: Nasopharyngeal Updated: 04/22/21 1105     Purpose of COVID testing Screening     SARS-CoV-2 Specimen Source Nasal Swab     SARS CoV 2 Overall Result Not Detected     Comment: __________________________________________________  -A result of "Detected" indicates POSITIVE for the    presence of SARS CoV-2 RNA  -A result of "Not Detected" indicates NEGATIVE for the    presence of SARS CoV-2 RNA  __________________________________________________________  Test performed using the Roche cobas Liat SARS-CoV-2 assay. This assay is  only for use under the Food and Drug Administration's Emergency Use  Authorization. This is a real-time RT-PCR assay for the qualitative  detection of SARS-CoV-2 RNA. Viral nucleic acids may persist in vivo,  independent of viability. Detection of viral nucleic acid does not imply the  presence of infectious virus, or that virus nucleic acid is the cause of  clinical symptoms. Negative results do not preclude SARS-CoV-2 infection and  should not be used as the sole basis for diagnosis, treatment or other  patient management decisions. Negative results must be combined with  clinical observations, patient history, and/or epidemiological information.  Invalid results may be due to inhibiting substances in the specimen and  recollection should occur. Please see Fact Sheets for patients and providers  located:  WirelessDSLBlog.no         Narrative:      o Collect and clearly label specimen type:  o PREFERRED-Upper respiratory specimen: One Nasal Swab in  Transport Media.  o Hand deliver to laboratory ASAP  Indication for testing->Extended care facility admission to  semi private room  Screening    Urine culture [[id number]] Collected: 04/22/21 0757    Order Status: Completed Specimen:  Bladder Updated: 04/23/21 1457    Narrative:      ORDER#:  [id number]                                    ORDERED BY: MILLS, CHRISTOP  SOURCE: Urine                                        COLLECTED:  04/22/21 07:57  ANTIBIOTICS AT COLL.:                                RECEIVED :  04/22/21 08:04  Culture Urine                              FINAL       04/23/21 14:57  04/23/21   No growth of >1,000 CFU/ML, No further work               RADIOLOGY     Radiological Procedure personally reviewed and concur with radiologist reports unless stated otherwise.    XR Hip Left 1 vw without pelvis   Final Result         1.  Postsurgical changes of recent cephalomedullary nail fixation for   the intertrochanteric fracture of the left proximal femur. Alignment is   improved from prior. No evidence of immediate hardware complication.       Vassie Moment, MD    04/22/2021 9:14 PM      Fluoroscopy less than 1 hour   Final Result       Fluoroscopic guidance provided without the presence of a radiologist.       Prince Solian, MD    04/22/2021 7:46 PM      CT Head without Contrast   Final Result       1.  No intracranial hemorrhage or acute intracranial findings.   2.  Parenchymal volume loss and chronic small vessel white matter   changes, similar compared to prior.      Brenton Grills, MD    04/22/2021 9:08 AM      CT Cervical Spine without Contrast   Final Result       No acute fracture or traumatic malalignment.      Brenton Grills, MD    04/22/2021 9:05 AM      XR Chest  AP Portable   Final Result      No detectable active cardiopulmonary disease or traumatic abnormality in   the chest.      Wilmon Pali, MD    04/22/2021 8:47 AM      XR Hip left 2-3 vw with Pelvis   Final Result      1.  Acute, slightly impacted and comminuted fracture of the left   proximal femoral intertrochanteric region with mild to moderate coxa   varus deformity.      These critical results were discussed with and acknowledged by   Secundino Ginger, MD on 04/22/2021 9:00 AM.      Candi Leash, MD    04/22/2021 9:00 AM      Femur  Left AP and Lateral   Final Result      1.  Acute, slightly impacted and comminuted fracture of  the left   proximal femoral intertrochanteric region with mild to moderate coxa   varus deformity.      These critical results were discussed with and acknowledged by   Secundino Ginger, MD on 04/22/2021 9:00 AM.      Candi Leash, MD    04/22/2021 9:00 AM          Signed,  Katy Apo, MD  8:43 AM 04/24/2021

## 2021-04-25 ENCOUNTER — Encounter: Payer: Self-pay | Admitting: Orthopaedic Surgery

## 2021-04-25 LAB — ECG 12-LEAD
Atrial Rate: 50 {beats}/min
Q-T Interval: 338 ms
QRS Duration: 62 ms
QTC Calculation (Bezet): 365 ms
R Axis: 42 degrees
T Axis: 51 degrees
Ventricular Rate: 70 {beats}/min

## 2021-04-25 MED ORDER — ACETAMINOPHEN 500 MG PO TABS
500.0000 mg | ORAL_TABLET | ORAL | 0 refills | Status: AC | PRN
Start: 2021-04-25 — End: ?

## 2021-04-25 MED ORDER — ZINC OXIDE 40 % EX PSTE
PASTE | Freq: Two times a day (BID) | CUTANEOUS | Status: DC
Start: 2021-04-25 — End: 2021-04-26
  Filled 2021-04-25: qty 57

## 2021-04-25 MED ORDER — CALCIUM CITRATE-VITAMIN D 315-250 MG-UNIT PO TABS
2.0000 | ORAL_TABLET | Freq: Every day | ORAL | 0 refills | Status: AC
Start: 2021-04-25 — End: ?

## 2021-04-25 MED ORDER — FERROUS SULFATE 324 (65 FE) MG PO TBEC
324.0000 mg | DELAYED_RELEASE_TABLET | Freq: Every morning | ORAL | 0 refills | Status: AC
Start: 2021-04-25 — End: ?

## 2021-04-25 MED ORDER — OXYCODONE HCL 5 MG PO TABS
5.0000 mg | ORAL_TABLET | Freq: Three times a day (TID) | ORAL | 0 refills | Status: AC | PRN
Start: 2021-04-25 — End: 2021-04-30

## 2021-04-25 MED ORDER — SENNOSIDES-DOCUSATE SODIUM 8.6-50 MG PO TABS
2.0000 | ORAL_TABLET | Freq: Two times a day (BID) | ORAL | 0 refills | Status: AC | PRN
Start: 2021-04-25 — End: ?

## 2021-04-25 MED ORDER — ENOXAPARIN SODIUM 40 MG/0.4ML IJ SOSY
40.0000 mg | PREFILLED_SYRINGE | INTRAMUSCULAR | 0 refills | Status: AC
Start: 2021-04-25 — End: ?

## 2021-04-25 NOTE — Progress Notes (Signed)
Pt is a resident of [address] [facility] [place] memory care, called to see if they can accomodate patient with his current pt/ot needs. Spoke with P and they stated they cant, prefer him to go to a SNF then he can come back. Daughter is on her way, I will give her options of SNF'S and submit the referral.

## 2021-04-25 NOTE — Discharge Instr - AVS First Page (Addendum)
Reason for your Hospital Admission:  Hip fracture      Instructions for after your discharge:  Subcu Lovenox for DVT prophylaxis for 1 month  PT OT  Check CBC, BMP in 2 days then weekly x4 times

## 2021-04-25 NOTE — Discharge Summary (Signed)
[facility]      Patient: Danny Sutton  Admission Date: 04/22/2021   DOB: 1941-04-24  Discharge Date: 04/25/2021    MRN: [id number]  Discharge Attending: Drue Dun, MD   Referring Physician: Kerin Perna, DO  PCP: Kerin Perna, DO       DISCHARGE SUMMARY     Discharge Information     Discharge Diagnosis:   #Left hip intertrochanteric fracture status post mechanical fall status post ORIF on 04/22/2021  #Advanced dementia/Parkinson disease  #Bacteriuria with no UTI  #Acute postoperative blood loss anemia      Admission Condition: poor  Discharge Condition: fair  Functional Status: Patient is not independent with mobility/ambulation, transfers, ADL's, IADL's.  Discharge Disposition: SNF versus back to memory unit at the [facility] in [place]    Discharge Medications:     Medication List        START taking these medications      acetaminophen 500 MG tablet  Commonly known as: TYLENOL  Take 1 tablet (500 mg total) by mouth every 4 (four) hours as needed for Pain     calcium citrate-vitamin D 315-250 MG-UNIT Tabs  Commonly known as: CITRACAL+D  Take 2 tablets by mouth daily     enoxaparin 40 MG/0.4ML syringe  Commonly known as: LOVENOX  Inject 0.4 mLs (40 mg total) into the skin every 24 hours For thirty days     ferrous sulfate 324 (65 FE) MG Tbec  Take 1 tablet (324 mg total) by mouth every morning with breakfast     oxyCODONE 5 MG immediate release tablet  Commonly known as: ROXICODONE  Take 1 tablet (5 mg total) by mouth every 8 (eight) hours as needed (moderate to severe pain)     senna-docusate 8.6-50 MG per tablet  Commonly known as: PERICOLACE  Take 2 tablets by mouth 2 (two) times daily as needed for Constipation            CONTINUE taking these medications      nystatin powder  Commonly known as: NYSTOP     petrolatum ointment     senna 8.6 MG tablet  Commonly known as: SENOKOT     sertraline 50 MG tablet  Commonly known as: ZOLOFT     Vitamin B-12 2500 MCG Subl               Where to Get  Your Medications        You can get these medications from any pharmacy    Bring a paper prescription for each of these medications  acetaminophen 500 MG tablet  calcium citrate-vitamin D 315-250 MG-UNIT Tabs  enoxaparin 40 MG/0.4ML syringe  ferrous sulfate 324 (65 FE) MG Tbec  oxyCODONE 5 MG immediate release tablet  senna-docusate 8.6-50 MG per tablet          Patient Lines/Drains/Airways Status       Active PICC Line / CVC Line / PIV Line / Drain / Airway / Intraosseous Line / Epidural Line / ART Line / Line / Wound / Pressure Ulcer / NG/OG Tube       Name Placement date Placement time Site Days    Peripheral IV 04/23/21 20 G Anterior;Right Forearm 04/23/21  1851  Forearm  1    External Urinary Catheter 04/22/21  --  --  3    Wound 04/22/21 Surgical Incision Hip Left 04/22/21  1908  Hip  2  Hospital Course   Presentation History   80 year old male with history of advanced Parkinson disease and advanced dementia, lives in the memory unit, had a witnessed fall and sustained left hip fracture.       See HPI for details.    Hospital Course (3 Days)   CT head and C-spine are negative, imaging showed left hip fracture, he had intertrochanteric and slightly impacted and comminuted fracture,   underwent ORIF, postoperative course has been uneventful.  Patient is a pleasantly confused, tolerating diet.  Had modest drop in hemoglobin from 13 down to 10, will be placed on p.o. iron  Patient will also be placed on oxycodone for pain control if he has moderate to severe pain, will use Tylenol for less severe pain.  Reviewed [place] PMP and no prior medications reviewed.  Patient will be dispensed a quantity of 10, prescription will be printed not the effects as he is going to a facility.    Spoke with daughter, plan is if the [facility] cannot care for him and meet his PT OT needs then he will go back there, discussed with case management who is reaching out to the facility.  Otherwise patient will go to SNF in  the meanwhile  He will need to be on Lovenox subcu 40 mg daily for 30 days due to high risk of VTE post hip fracture surgery.  Discussed with daughter, no increased risk of bleeding    Also reaching out to Dr. Montez Morita regarding dressing change without needs to be done prior to discharge call upon follow-up    Called daughter and updated     .    Procedures/Imaging:   XR Hip Left 1 vw without pelvis   Final Result         1.  Postsurgical changes of recent cephalomedullary nail fixation for   the intertrochanteric fracture of the left proximal femur. Alignment is   improved from prior. No evidence of immediate hardware complication.       Vassie Moment, MD    04/22/2021 9:14 PM      Fluoroscopy less than 1 hour   Final Result       Fluoroscopic guidance provided without the presence of a radiologist.       Prince Solian, MD    04/22/2021 7:46 PM      CT Head without Contrast   Final Result       1.  No intracranial hemorrhage or acute intracranial findings.   2.  Parenchymal volume loss and chronic small vessel white matter   changes, similar compared to prior.      Brenton Grills, MD    04/22/2021 9:08 AM      CT Cervical Spine without Contrast   Final Result       No acute fracture or traumatic malalignment.      Brenton Grills, MD    04/22/2021 9:05 AM      XR Chest  AP Portable   Final Result      No detectable active cardiopulmonary disease or traumatic abnormality in   the chest.      Wilmon Pali, MD    04/22/2021 8:47 AM      XR Hip left 2-3 vw with Pelvis   Final Result      1.  Acute, slightly impacted and comminuted fracture of the left   proximal femoral intertrochanteric region with mild to moderate coxa   varus deformity.      These critical  results were discussed with and acknowledged by   Secundino Ginger, MD on 04/22/2021 9:00 AM.      Candi Leash, MD    04/22/2021 9:00 AM      Femur Left AP and Lateral   Final Result      1.  Acute, slightly impacted and comminuted fracture of the left   proximal femoral  intertrochanteric region with mild to moderate coxa   varus deformity.      These critical results were discussed with and acknowledged by   Secundino Ginger, MD on 04/22/2021 9:00 AM.      Candi Leash, MD    04/22/2021 9:00 AM          Treatment Team:   Attending Provider: Drue Dun, MD               Progress Note/Physical Exam at Discharge     Subjective: Pleasantly confused, per RN incontinant to urine      Vitals:    04/24/21 2323 04/25/21 0413 04/25/21 0414 04/25/21 0739   BP: 103/68 96/60  100/64   Pulse: 78 (!) 54 67 78   Resp: 16 18  17    Temp: 99 F (37.2 C) 97.9 F (36.6 C)  98.1 F (36.7 C)   TempSrc: Axillary Oral  Oral   SpO2: 96% 97% 96% 94%   Weight:           General: NAD, AAOx0  HEENT: perrla, eomi, sclera anicteric, OP: Clear, MMM  Neck: supple, FROM, no LAD  Cardiovascular: RRR, no m/r/g  Lungs: CTAB, no w/r/r  Abdomen: soft, +BS, NT/ND, no masses, no g/r  Extremities: LLE dressing C/D /D. Thigh not tense  Neuro: CN 2-12 intact; No Focal neurological deficits           Diagnostics     Labs/Studies Pending at Discharge: No    Last Labs   Recent Labs   Lab 04/24/21  0527 04/23/21  0440 04/22/21  0742   WBC 7.45 10.45* 6.01   RBC 3.22* 3.66* 4.35   Hgb 10.0* 11.4* 13.5   Hematocrit 29.7* 33.5* 39.2   MCV 92.2 91.5 90.1   Platelets 222 271 283       Recent Labs   Lab 04/24/21  0527 04/23/21  0440 04/22/21  0742   Sodium 140 141 138   Potassium 3.9 4.3 3.9   Chloride 107 107 107   CO2 25 22 20    BUN 13.0 14.0 17.0   Creatinine 0.8 1.0 0.9   Glucose 116* 183* 118*   Calcium 8.2 8.5 9.2   Magnesium  --  1.6  --        Microbiology Results (last 15 days)       Procedure Component Value Units Date/Time    COVID-19 (SARS-CoV-2) only (Liat Rapid) asymptomatic admission - Hospitals [[id number]] Collected: 04/22/21 1015    Order Status: Completed Specimen: Nasopharyngeal Updated: 04/22/21 1105     Purpose of COVID testing Screening     SARS-CoV-2 Specimen Source Nasal Swab     SARS CoV 2 Overall Result  Not Detected     Comment: __________________________________________________  -A result of "Detected" indicates POSITIVE for the    presence of SARS CoV-2 RNA  -A result of "Not Detected" indicates NEGATIVE for the    presence of SARS CoV-2 RNA  __________________________________________________________  Test performed using the Roche cobas Liat SARS-CoV-2 assay. This assay is  only for use under the Food and Drug Administrations  Emergency Use  Authorization. This is a real-time RT-PCR assay for the qualitative  detection of SARS-CoV-2 RNA. Viral nucleic acids may persist in vivo,  independent of viability. Detection of viral nucleic acid does not imply the  presence of infectious virus, or that virus nucleic acid is the cause of  clinical symptoms. Negative results do not preclude SARS-CoV-2 infection and  should not be used as the sole basis for diagnosis, treatment or other  patient management decisions. Negative results must be combined with  clinical observations, patient history, and/or epidemiological information.  Invalid results may be due to inhibiting substances in the specimen and  recollection should occur. Please see Fact Sheets for patients and providers  located:  WirelessDSLBlog.no         Narrative:      o Collect and clearly label specimen type:  o PREFERRED-Upper respiratory specimen: One Nasal Swab in  Transport Media.  o Hand deliver to laboratory ASAP  Indication for testing->Extended care facility admission to  semi private room  Screening    Urine culture [[id number]] Collected: 04/22/21 0757    Order Status: Completed Specimen: Bladder Updated: 04/23/21 1457    Narrative:      ORDER#: [id number]                                    ORDERED BY: MILLS, CHRISTOP  SOURCE: Urine                                        COLLECTED:  04/22/21 07:57  ANTIBIOTICS AT COLL.:                                RECEIVED :  04/22/21 08:04  Culture Urine                               FINAL       04/23/21 14:57  04/23/21   No growth of >1,000 CFU/ML, No further work               Patient Instructions   Discharge Diet: regular diet  Discharge Activity:  activity as tolerated    Follow Up Appointment:   Follow-up Information       Theressa Millard, MD Follow up.    Specialty: Orthopaedic Surgery  Contact information:  [address]  [place] [address]  [phone]               Behiri, Amr H, DO Follow up in 1 week(s).    Specialty: Internal Medicine  Contact information:  [address]  [place] [address]  [phone]                              Time spent examining patient, discussing with patient/family regarding hospital course, chart review, reconciling medications and discharge planning: 40 minutes.    Signed,  Drue Dun, MD  9:31 AM 04/25/2021     This note was generated by the [facility] EMR system/Dragon speech recognition and may contain inherent errors or omissions not intended by the user. Grammatical errors, random word insertions, deletions, pronoun errors and incomplete  sentences are occasional consequences of this technology due to software limitations. Not all errors are caught or corrected. If there are questions or concerns about the content of this note or information contained within the body of this dictation they should be addressed directly with the author for clarification.

## 2021-04-25 NOTE — OT Progress Note (Signed)
Occupational Therapy Treatment -[facility]  Patient: Danny Sutton   MRN#: [id number]   Bed: V409/W119-14    Post Acute Care Therapy Recommendations:   Discharge Recommendation: SNF    If above recommendation is not available, pt will need 24 hour assist, memory care unit with HHOT/PT, for safe discharge.    DME Recommended for Discharge: Front wheel walker (daughter purchasing bedrails) IF discharging home.     Assessment:   Pt made good progress today, limited by pain as evidence by grimacing, pt rubbing LLE, resisting at times, and stated "yes" when asked if pt has pain sitting EOB. Pt awake, closing eyes at times, daughter present. Pt required 2 person assist for functional mobility due to cognitive deficits, pain and pt requiring significant/inconsistent assistance. He was able to tolerate sitting EOB 2-3 minutes with fluctuating support (max to CGA). Unable to stand with 2 attempts, pt rubbing L thigh, holding L foot off floor when attempted to stand pt and pt attempting to return to supine. Daughter educated in non pharmacological pain management including positioning and ice/cold packs.    Plan:      OT Plan  Risks/Benefits/POC Discussed with Pt/Family: With family;Patient unable to participate in goal setting  Treatment Interventions: ADL retraining;Functional transfer training;UE strengthening/ROM;Cognitive reorientation;Patient/Family training;Equipment eval/education;Compensatory technique education  Discharge Recommendation: SNF  DME Recommended for Discharge: Front wheel walker (daughter purchasing bedrails)  OT Frequency Recommended: 3-4x/wk     Continue plan of care.      Education:   Further education provided to daughter on , safety with mobility and ADLs, positioning and pain mangement.  Demonstrated good understanding.  Pt unable to carryover education due to dementia.    Interdisciplinary Communication:   Patient in bed, sitting up with daughter at bedside and call bell within reach. Communicated with  nursing,  regarding pt's pain behaviors- nursing issuing pain meds.    Treatment:    Time Calculation  OT Received On: 04/25/21  Start Time: 1305  Stop Time: 1320  Time Calculation (min): 15 min    Treatment # 2    Precautions and Contraindications:    Precautions  Weight Bearing Status: no restrictions (WBAT LLE, s/p fx and sx)  Other Precautions: Falls, Dementia      Subjective:  .       Patient's medical condition is appropriate for Occupational Therapy intervention at this time.  Family and/or guardian are agreeable to patient's participation in the therapy session. Nursing clears patient for therapy.  Pain Assessment  Pain Assessment: FLACC (pt grimaces, reports "yes" when asked about pain)    Objective:  Patient is in bed with peripheral IV and condom catheter in place. Provided cold pack/ice to L hip.     Cognition/Neuro Status  Attention Span: Difficulty attending to directions  Orientation Level: Unable to assess  Memory:  (impaired, dementia)  Following Commands: Does not follow commands (follow simple command 25% when awake and provide max tactile and verbal cueing, inconsistent)  Safety Awareness:  (no awareness)  Problem Solving:  (dependent)  Behavior:  (pain)    Functional Mobility  Rolling: Dependent (grimacing, positioned LLE for improved support, pain management)  Supine to Sit Transfers: Dependent (x2)  Sit to Supine Transfers: Dependent (x2)  Sit to Stand Transfers:  (attempted, unable, resistive)  Treatment Activities:  Sitting tolerance EOB 2-3 minutes with fluctuating assist for balance max A to CGA briefly.     Self-care and Home Management  Eating: Dependent (daughter at bedside feeding pt as able "he  isn't really eating today")  Grooming: Dependent  LB Dressing: Dependent;Don/doff R sock;Don/doff L sock  Toileting: Dependent (external condom cath)  Treatment Activities: Encouraged PO/fluids- dependent to self feed. Daughter present and attempting.      Signature: Daria Pastures, OT

## 2021-04-25 NOTE — Consults (Signed)
Wound Ostomy Continence Consultation    Date Time: 04/25/21 9:23 AM  Patient Name: Danny Sutton  Consulting Service: [facility] Day: 4     Reason for Consult   Incontinence/Moisture     Assessment & Plan   Assessment:   Bilateral heels intact with blanchable erythema.  Surgical incision to left hip, dressing C/D/I.  Sacralcoccygeal skin with generalized blanchable erythema scattered areas of moist desquamation, mostly at the intergluteal crease. This is c/w MASD.    Wound Photography: n/a    Plan/Follow-Up:  Recommend to continue PI prevention interventions.  Recommend use of Desitin to sacralcoccygeal skin q12h.  Discussed with primary RN. WOC service will continue to follow and assist as necessary during this hospitalization.    Objective Findings   Braden Scale Score: 13 (04/24/21 2320)  Braden Subscales:  Sensory Perceptions: Very limited (04/24/21 2320)  Moisture: Occasionally moist (04/24/21 2320)  Activity: Bedfast (04/24/21 2320)  Mobility: Very limited (04/24/21 2320)  Nutrition: Adequate (04/24/21 2320)  Friction and Shear: Potential problem (04/24/21 2320)    Ht Readings from Last 1 Encounters:   05/16/19 1.753 m (5\' 9" )     Wt Readings from Last 3 Encounters:   04/22/21 73.4 kg (161 lb 13.1 oz)   04/10/21 70.6 kg (155 lb 10.3 oz)   05/16/19 72.6 kg (160 lb)     Body mass index is 23.9 kg/m.    Current Diet:   Diet regular  Ensure Enlive Supplement Quantity: A. One; Frequency: TID (3 times a day) with meals     Specialty Bed:   Versacare with AccuMax appropriate    History of Present Illness   This is a 80 y.o. male  has a past medical history of Alzheimer disease, Dementia, Heart murmur, Hypertension, Prostate cancer, Prostate cancer, and Prostate cancer..  Admitted with Closed displaced intertrochanteric fracture of left femur, initial encounter      Pertinent skin and/or ostomy history includes n/a.        Procedure(s):  OPEN REDUCTION, HIP, INSERTION IM NAIL (GAMMA)    3 Days  Post-Op  -------------------        Lab   Significant Lab Values:    No results for input(s): WBC, RBC, HGB, HCT, LABPLAT, NA, K, CL, CO2, BUN, CREAT, CA, ALB, EGFR, GLU in the last 24 hours.    Invalid input(s): [facility]    Culture Results n/a    Lowry Ram BSN, RN, PCCN  Wound and Ostomy   [phone]

## 2021-04-25 NOTE — Plan of Care (Signed)
Pt alert, but disoriented x4. Pain assessed using PAINAD, managed with PRN oxycodone. Scheduled medications tolerated well by pt. Dressing on surgical site remained C/D/I. No BM. Q2hr repositioning being done, no acute changes overnight.     Problem: Pain  Goal: Pain at adequate level as identified by patient  Outcome: Progressing  Flowsheets (Taken 04/22/2021 1514 by Mardella Layman, RN)  Pain at adequate level as identified by patient:   Identify patient comfort function goal   Assess for risk of opioid induced respiratory depression, including snoring/sleep apnea. Alert healthcare team of risk factors identified.   Evaluate patient's satisfaction with pain management progress   Reassess pain within 30-60 minutes of any procedure/intervention, per Pain Assessment, Intervention, Reassessment (AIR) Cycle   Assess pain on admission, during daily assessment and/or before any "as needed" intervention(s)     Problem: Side Effects from Pain Analgesia  Goal: Patient will experience minimal side effects of analgesic therapy  Outcome: Progressing  Flowsheets (Taken 04/25/2021 0426)  Patient will experience minimal side effects of analgesic therapy:   Monitor/assess patient's respiratory status (RR depth, effort, breath sounds)   Prevent/manage side effects per LIP orders (i.e. nausea, vomiting, pruritus, constipation, urinary retention, etc.)   Evaluate for opioid-induced sedation with appropriate assessment tool (i.e. POSS)   Assess for changes in cognitive function

## 2021-04-25 NOTE — Progress Notes (Signed)
Daughter picked a few SNF'S, [facility] said yes today. However daughter read the reviews and doesn't want him to go there. Awaiting a response from the other selections.

## 2021-04-25 NOTE — Consults (Addendum)
Nutrition Assessment    Danny Sutton 80 y.o. male   MRN: [id number]      Reason for Assessment: consult- concern for malnutrition    Nutrition Recommendation:  Continue current diet and ONS of Ensure HP Plus TID  Assist pt with meals  Daily weights  Monitor bowel function as last BM documented was 3 days ago  ______________________________________________________________________    Assessment Data:  Summary: 81 year old male with history of advanced Parkinson disease and advanced dementia, lives in the memory unit, had a witnessed fall and sustained left hip fracture. Discharge to SNF plan. Wound care following for MASD.     Pt alert but not oriented. Unable to answer any questions asked at this time. Per EMR pt wt appears stable over past 3 years. Informed RN of no BM since admit, encouraged use of PRN bowel medications.     Adm dx:  Closed displaced intertrochanteric fracture of left femur, initial encounter   Patient Active Problem List   Diagnosis    Cognitive decline    Observation for suspected genetic or metabolic condition    Pre-procedure lab exam    Impaired mobility and activities of daily living    Closed displaced intertrochanteric fracture of left femur, initial encounter     PMH:  has a past medical history of Alzheimer disease, Dementia, Heart murmur, Hypertension, Prostate cancer, Prostate cancer, and Prostate cancer.    Recent Labs   Lab 04/24/21  0527 04/23/21  0440 04/22/21  0742   Sodium 140 141 138   Potassium 3.9 4.3 3.9   Chloride 107 107 107   CO2 25 22 20    BUN 13.0 14.0 17.0   Creatinine 0.8 1.0 0.9   Glucose 116* 183* 118*   Calcium 8.2 8.5 9.2   Magnesium  --  1.6  --      Current Facility-Administered Medications   Medication Dose Route Frequency    acetaminophen  1,000 mg Oral Q8H SCH    calcium citrate-vitamin D  2 tablet Oral Daily    heparin (porcine)  5,000 Units Subcutaneous Q12H SCH    senna-docusate  2 tablet Oral BID    sertraline  100 mg Oral Daily    vitamins/minerals  1 tablet  Oral Daily    zinc Oxide   Topical Q12H SCH     Orders Placed This Encounter   Procedures    Diet regular    Ensure Enlive Supplement Quantity: A. One; Frequency: TID (3 times a day) with meals   Food intake: 25-100%    No Known Allergies    Nutrition Focused Physical Exam:  Head: temple region: slight depression with decrease in muscle tone/resistance (moderate muscle loss - temporalis), orbital region: slightly dark circles, somewhat hollow look, some decrease in bounce back of fat pads (moderate fat loss), and buccal region: slight depression, somewhat sunken appearance, flat cheeks, decrease in bounce back of fat pads (moderate fat loss)  Upper Body - clavicle bone region: some protrusion of the clavicle with decrease in muscle tone/resistance (moderate muscle loss - pectoralis major), shoulder and acromion bone region: slight protrusion of acromion process, decrease in muscle tone/resistance (mild muscle loss - deltoid), and upper arm region: some fat in pinch between fingers, but not ample (mild fat loss)  Lower Body - posterior calf region: some shape to the bulb, but not well-developed, decrease in muscle tone/resistance (mild muscle loss - gastrocnemius)  Edema - none noted  Skin - WDL; L hip surgical incision  GI  function - WDL    Anthropometrics  Height: 175.3 cm (5' 9.02")  Weight: 73.4 kg (161 lb 13.1 oz)  IBW/kg (Calculated) Male: 72.79 kg  IBW/kg (Calculated) Male: 65.93 kg  Weight Monitoring Weight Weight Method   06/10/2018 75.2 kg Bed Scale   05/16/2019 72.576 kg    04/10/2021 70.6 kg Standing Scale   04/22/2021 73.4 kg Bed Scale     Total Daily Energy Needs: 1835 to 2202 kcal  Method for Calculating Energy Needs: 25 kcal - 30 kcal per kg  at 73.4 kg (Actual body weight)     Total Daily Protein Needs: 0  Method for Calculating Protein Needs: 1 g - 1.3 g per kg at 0 kg (Actual body weight)     Total Daily Fluid Needs: 1835 to 2202 ml  Method for Calculating Fluid Needs: 25 ml - 30 ml  per kg at  73.4 kg (Actual body weight)       Nutrition Diagnosis:   Moderate Malnutrition related to inadequate intake in context of chronic disease as evidenced by moderate/mild muscle loss (temporalis, pectoralis, deltoid, gastrocnemius), mild/moderate fat loss (buccal, orbital, triceps). - new    Intervention:  Goal: Meet >75% of needs  Plan:  Continue current diet and ONS of Ensure HP Plus TID  Assist pt with meals  Daily weights  Monitor bowel function as last BM documented was 3 days ago    Monitoring/Evaluation:   PO intake  Weights  GI symptoms      Danny Vangilder S. Allena Katz MS, RD  Clinical Dietitian

## 2021-04-25 NOTE — PT Progress Note (Signed)
Physical Therapy Note    Physical Therapy Treatment    Danny Sutton    Unit: 4[facility] MEDICAL  Bed: R604/V409-81      Post Acute Care Therapy Recommendations:   Discharge Recommendations:  SNF    If SNF  recommended discharge disposition is not available, patient will need to return to memory care, 24 hour assist for functional mobility and HHPT.     DME needs IF patient is discharging home: Front wheel walker;Wheelchair-manual;BSC;Hospital bed    Therapy discharge recommendations may change with patient status.  Please refer to most recent note for up-to-date recommendations.    Recommended  (non-medical) mode of transportation at discharge:   stretcher    PMP - Progressive Mobility Protocol   PMP Activity: Step 4 - Dangle at Bedside  Distance Walked (ft) (Step 6,7): 0 Feet    Assessment:   Increased active participation, able to sit EOB and attempt sit to stand transfers.       Prognosis: Good;With continued PT status post acute discharge      Interdisciplinary Communication:   Patient up in bed with alarm activated; call bell within reach. Updated white communication board in room with patient's current mobility status.  Communicated with RR, via FTF regarding pt's functional mobility status.    Patient's daughter present for PT session.    Education:   Further education provided to patient on importance of participation in PT, safety with mobility and ADLs, falls prevention.  Demonstrated poor understanding with all.  Will benefit from further training to focus on functional mobility status.      Plan:   Goals  Pt Will Go Supine To Sit: with moderate assist;Not met  Pt Will Perform Sit to Stand: with moderate assist;Not met  Plan  Risks/Benefits/POC Discussed with Pt/Family: With patient/family  Patient Goal: none voiced  Treatment/Interventions: Exercise;Gait training;Stair training;Neuromuscular re-education;Functional transfer training;LE strengthening/ROM;Endurance training;Cognitive reorientation;Patient/family  training;Equipment eval/education;Bed mobility  PT Frequency: 3-4x/wk    Continue plan of care.    ------------------------------------------------------------------------------------------------------------------    Time of treatment:   Start Time: 1321 Stop Time: 1336  Time Calculation (min): 15 min  PT Received On: 04/25/21      Treatment # 3      Precautions  Weight Bearing Status: no restrictions (WBAT LLE, s/p fx and sx)  Other Precautions: Falls, Dementia                      Patient's medical condition is appropriate for Physical Therapy intervention at this time.     Subjective:   Patient is agreeable to participation in the therapy session.  Patient Goal: none voiced               PAINAD  Breathing, Independent of vocalization: Occasional labored breathing, short period of hyperventilation  Negative Vocalization: Occasional moan or groan, low-level speech with negative or disapproving quality  Facial Expression: Facial grimacing  Body Language: Rigid, fists clenched knees pulled up, pulling or pushing away, striking out  Consolability: Unable to console, distract, or reassure  PAINAD Score: 8          Objective:  Patient is in bed with male external catheter in place.  Cognition/Neuro Status  Attention Span: Difficulty attending to directions  Orientation Level: Unable to assess  Memory:  (impaired, dementia)  Following Commands: Does not follow commands (follow simple command 25% when awake and provide max tactile and verbal cueing, inconsistent)  Safety Awareness:  (no awareness)  Problem Solving:  (dependent)  Behavior:  (pain)    Functional Mobility  Rolling: Dependent  Supine to Sit: Dependent  Scooting to HOB: Dependent  Scooting to EOB: Dependent  Sit to Supine: Dependent  Functional Mobility Deferred (Comment): unable to reach full standing                   Neuro Re-Ed  Sitting Balance: elbow propping;with instruction;with support  (wavering between CGA x 1 and Max A x1)          Treatment Activities:  Bed Mobility w/ verbal and tactile cues for technique, sequencing, rail use, and hand placement.  Sitting balance w/ elbow propping and verbal and tactile cues for hand placement, and rail use.  Attempted transfer w/ max verbal and tactile cues for technique, sequencing, rail use, walker use, and hand placement.  Patient unable to reach full standing          Signature: Jacqulyn Cane, PT, DPT          Unit: 4[facility] MED[facility]  Bed: [phone]

## 2021-04-25 NOTE — Malnutrition Assessment (Signed)
Danny Sutton is a 80 y.o. male patient.   [id number]      Malnutrition Documentation    Moderate Malnutrition related to inadequate intake in context of chronic disease as evidenced by moderate/mild muscle loss (temporalis, pectoralis, deltoid, gastrocnemius), mild/moderate fat loss (buccal, orbital, triceps). - new    Danny Sutton S. Allena Katz MS, RD  Clinical Dietitian      If physician disagrees with this assessment see addendum.

## 2021-04-26 LAB — COVID-19 (SARS-COV-2): SARS CoV 2 Overall Result: NOT DETECTED

## 2021-04-26 NOTE — OT Progress Note (Signed)
Occupational Therapy Treatment -[facility]  Patient: Danny Sutton   MRN#: [id number]   Bed: G295/M841-32    Post Acute Care Therapy Recommendations:   Discharge Recommendation: SNF    If above recommendation is not available, pt will need 24 hour assist, memory care unit with HHOT/PT, for safe discharge.    DME Recommended for Discharge: Front wheel walker IF discharging home.     Assessment:   Pt more responsive today with more single word verbalization noted "no" "yes" "yum" "ok".   Pt with no pain behaviors sitting up in bed supported for feeding and grooming tasks. Able to bring hand to mouth to take 5 bites of toast without assist once toast placed in hand. Distracted at times, requiring frequent cues to stay on task with feeding. Cold pack/ice applied to L hip, nursing aware.    Plan:   Goal Formulation: Family  OT Plan  Risks/Benefits/POC Discussed with Pt/Family: With family;Patient unable to participate in goal setting  Treatment Interventions: ADL retraining;Functional transfer training;UE strengthening/ROM;Cognitive reorientation;Patient/Family training;Equipment eval/education;Compensatory technique education  Discharge Recommendation: SNF  DME Recommended for Discharge: Front wheel walker  OT Frequency Recommended: 3-4x/wk    Time For Goal Achievement: 5 visits  ADL Goals  Patient will feed self: Moderate Assist;5 visits (finger foods)  Patient will groom self: 5 visits;Moderate Assist (to wash face)  Mobility and Transfer Goals  Pt will transfer bed to [facility]: Maximal Assist;5 visits  Neuro Re-Ed Goals  Pt will sit at edge of bed: Stand by Assist;for 5 minutes;to prepare for OOB tasks;5 visits  Musculoskeletal Goals  Pt will perform Home Exercise Program: maximal assist;with caregiver/family assist;to increase engagement in ADLs;5 visits     Continue plan of care.    Education:   Pt educated on role of OT and oriented to place and reason for admission. Pt does not carryover information taught, hx dementia.  Daughter not present this am    Interdisciplinary Communication:   Patient in bed, bed alarm on, television turned on low volume and call bell within reach. Communicated with nursing regarding cold pack applied L hip.    Treatment:    Time Calculation  OT Received On: 04/26/21  Start Time: 0912  Stop Time: 1320  Time Calculation (min): 15 min    Treatment # 3    Precautions and Contraindications:    Precautions  Weight Bearing Status: LLE WBAT  Other Precautions: Falls, Dementia      Subjective:  .       Patient's medical condition is appropriate for Occupational Therapy intervention at this time.  Patient is agreeable to participation in the therapy session. Nursing clears patient for therapy.  Pain Assessment  Pain Assessment: FLACC    Objective:  Patient is in bed with SCD's, peripheral IV, and condom catheter in place. Dressing L hip intact    Cognition/Neuro Status  Arousal/Alertness: Inconsistent responses to stimuli  Attention Span: Difficulty attending to directions  Memory: Unable to assess  Following Commands: Follows one step commands with repetition;3 times; with max cueing verbal and tactile cues for feeding/grooming tasks (more automatic), otherwise, not following commands  Behavior: calm     Treatment Activities:    Self-care and Home Management  Eating: Maximal Assist;Dependent;Increased time to complete;Bringing food to mouth assist;Other (Comment) (finger food, able to bring to mouth once placed in hand 5x. Dependent to drink and utilize utensils)  Grooming: Dependent;wash/dry hands;wash/dry face;teeth care (hand over hand assist)  Treatment Activities:  Focused on self feeding and grooming  tasks sitting up in bed, pt able to feed self finger foods at baseline with supervision. Increased time and redirection required for feeding and grooming.    Treatment Modalities  Ice Pack:  (provided cold pack/ice to L hip, nursing aware)    Signature: Daria Pastures, MS, OTR/L

## 2021-04-26 NOTE — Plan of Care (Signed)
Pt disorientated x4. On room air. Pt shows no signs of pain or discomfort this shift. External cath changed this shift. Pt repositioned every 2 hours as tolerated. Surgical dressing site clean, dry, and intact. No acute changes overnight. Pt in bed resting. Bed alarm on, locked, and set to lowest position. Call bell within reach.     Problem: Moderate/High Fall Risk Score >5  Goal: Patient will remain free of falls  Outcome: Progressing  Flowsheets (Taken 04/25/2021 2130)  High (Greater than 13):   HIGH-Consider use of low bed   HIGH-Apply yellow "Fall Risk" arm band   HIGH-Bed alarm on at all times while patient in bed   MOD-Include family in multidisciplinary POC discussions   MOD-Use of assistive devices -Bedside Commode if appropriate   MOD-Remain with patient during toileting   MOD-Re-orient confused patients   LOW-Fall Interventions Appropriate for Low Fall Risk     Problem: Safety  Goal: Patient will be free from injury during hospitalization  Outcome: Progressing  Flowsheets (Taken 04/26/2021 0443)  Patient will be free from injury during hospitalization:   Assess patient's risk for falls and implement fall prevention plan of care per policy   Provide and maintain safe environment   Use appropriate transfer methods   Ensure appropriate safety devices are available at the bedside   Include patient/ family/ care giver in decisions related to safety   Hourly rounding   Assess for patients risk for elopement and implement Elopement Risk Plan per policy  Goal: Patient will be free from infection during hospitalization  Outcome: Progressing  Flowsheets (Taken 04/26/2021 0443)  Free from Infection during hospitalization:   Monitor lab/diagnostic results   Assess and monitor for signs and symptoms of infection   Monitor all insertion sites (i.e. indwelling lines, tubes, urinary catheters, and drains)   Encourage patient and family to use good hand hygiene technique     Problem: Pain  Goal: Pain at adequate level as  identified by patient  Outcome: Progressing  Flowsheets (Taken 04/26/2021 0443)  Pain at adequate level as identified by patient:   Identify patient comfort function goal   Assess for risk of opioid induced respiratory depression, including snoring/sleep apnea. Alert healthcare team of risk factors identified.   Assess pain on admission, during daily assessment and/or before any "as needed" intervention(s)   Reassess pain within 30-60 minutes of any procedure/intervention, per Pain Assessment, Intervention, Reassessment (AIR) Cycle   Evaluate if patient comfort function goal is met   Evaluate patient's satisfaction with pain management progress   Offer non-pharmacological pain management interventions     Problem: Compromised Tissue integrity  Goal: Damaged tissue is healing and protected  Outcome: Progressing  Flowsheets (Taken 04/26/2021 0443)  Damaged tissue is healing and protected:   Monitor/assess Braden scale every shift   Reposition patient every 2 hours and as needed unless able to reposition self   Increase activity as tolerated/progressive mobility   Relieve pressure to bony prominences for patients at moderate and high risk   Avoid shearing injuries   Keep intact skin clean and dry   Use bath wipes, not soap and water, for daily bathing   Use incontinence wipes for cleaning urine, stool and caustic drainage. Foley care as needed   Monitor external devices/tubes for correct placement to prevent pressure, friction and shearing  Goal: Nutritional status is improving  Outcome: Not Progressing  Flowsheets (Taken 04/26/2021 0443)  Nutritional status is improving:   Assist patient with eating   Encourage  patient to take dietary supplement(s) as ordered   Allow adequate time for meals   Include patient/patient care companion in decisions related to nutrition

## 2021-04-26 NOTE — Progress Notes (Signed)
Patient disoriented. VSS, RA, Given oxycodone for pain. Pt discharging to [facility] with MMT. Report given to RN @ Rehab, all questions answered. Daughter at bedside. IV removed, belongings gathered. Pt left the floor in stretcher.

## 2021-04-26 NOTE — PT Progress Note (Signed)
Physical Therapy Note    Physical Therapy Treatment    Danny Sutton    Unit: 4[facility]  Bed: Z610/R604-54      Post Acute Care Therapy Recommendations:   Discharge Recommendations:  SNF     If SNF  recommended discharge disposition is not available, patient will need to return to memory care, 24 hour assist for functional mobility and HHPT.      DME needs IF patient is discharging home: Front wheel walker;Wheelchair-manual;BSC;Hospital bed     Therapy discharge recommendations may change with patient status.  Please refer to most recent note for up-to-date recommendations.     Recommended  (non-medical) mode of transportation at discharge:   stretcher      PMP - Progressive Mobility Protocol   PMP Activity: Step 4 - Dangle at Bedside (stood at EOB x 2)    Assessment:   Patient more verbal today and able to actively participate in PT.  Able to stand x 2 w/ RW and Max A x 2       Prognosis: With continued PT status post acute discharge;Good      Interdisciplinary Communication:   Patient up in bed with alarm activated; call bell within reach. Updated white communication board in room with patient's current mobility status.  Communicated with Jeannette Corpus, RN via FTF regarding pt's functional mobility status.    Education:   Further education provided to patient on importance of participation in PT, safety with mobility and ADLs, falls prevention.  Demonstrated poor understanding with all.  Will benefit from further training to focus on functional mobility.      Plan:   Goals  Pt Will Go Supine To Sit: with moderate assist;Not met  Pt Will Perform Sit to Stand: with moderate assist;Not met  Plan  Risks/Benefits/POC Discussed with Pt/Family: With patient/family  Patient Goal: none voiced  Treatment/Interventions: Exercise;Gait training;Stair training;Neuromuscular re-education;Functional transfer training;LE strengthening/ROM;Endurance training;Cognitive reorientation;Patient/family training;Equipment eval/education;Bed  mobility  PT Frequency: 3-4x/wk    Continue plan of care.    ------------------------------------------------------------------------------------------------------------------    Time of treatment:   Start Time: 1050 Stop Time: 1120  Time Calculation (min): 30 min  PT Received On: 04/26/21      Treatment # 4      Precautions  Weight Bearing Status: LLE WBAT  Other Precautions: Falls, Dementia                      Patient's medical condition is appropriate for Physical Therapy intervention at this time.     Subjective:   Patient is agreeable to participation in the therapy session.  Patient Goal: none voiced   Pain Assessment  Pain Assessment: PAINAD           PAINAD  Breathing, Independent of vocalization: Occasional labored breathing, short period of hyperventilation  Negative Vocalization: Occasional moan or groan, low-level speech with negative or disapproving quality  Facial Expression: Sad, frightened, frown  Body Language: Tense, distressed pacing, fidgeting  Consolability: Distracted or rassured by voice or touch  PAINAD Score: 5          Objective:  Patient is in bed with male external catheter in place.  Cognition/Neuro Status  Arousal/Alertness: Inconsistent responses to stimuli  Attention Span: Difficulty attending to directions  Memory: Unable to assess  Following Commands: Follows one step commands with repetition;3 times;Does not follow commands (with max cueing (verbal and tactile cues) for feeding/grooming tasks (more automatic), otherwise, not following commands)  Behavior: calm    Functional Mobility  Rolling: Maximal Assist  Supine to Sit: Maximal Assist  Scooting to HOB: Maximal Assist (x 2)  Scooting to EOB: Maximal Assist  Sit to Supine: Maximal Assist  Sit to Stand: Maximal Assist (x 2)  Stand to Sit: Maximal Assist (x 2)                   Neuro Re-Ed  Standing Balance: standing weight shifting all planes;with instruction;with  support;maximal assist          Treatment Activities:  Bed Mobility w/ verbal and tactile cues for technique, sequencing, rail use, and hand placement.  Sitting balance w/ reaching and verbal and tactile cues for hand placement, and rail use.  Able to perform sit to stand transfer x 2 w/ max verbal and tactile cues for technique, sequencing, rail use, walker use, and hand placement.  Patient able to reach full standing.  Patient performed weight shifting laterally, however unable to take steps          Signature: Jacqulyn Cane, PT, DPT          Unit: [facility]  Bed: [id number]

## 2021-04-26 NOTE — Discharge Summary (Addendum)
[facility]      Patient: Danny Sutton  Admission Date: 04/22/2021   DOB: 06-Sep-1940  Discharge Date: 04/26/2021    MRN: [id number]  Discharge Attending: Drue Dun, MD   Referring Physician: Kerin Perna, DO  PCP: Kerin Perna, DO       DISCHARGE SUMMARY     Discharge Information     Discharge Diagnosis:   #Left hip intertrochanteric fracture status post mechanical fall status post ORIF on 04/22/2021  #Advanced dementia/Parkinson disease  #Bacteriuria with no UTI  #Acute postoperative blood loss anemia  #Moderate malnutrition      Admission Condition: poor  Discharge Condition: fair  Functional Status: Patient is not independent with mobility/ambulation, transfers, ADL's, IADL's.  Discharge Disposition: SNF versus back to memory unit at the [facility] in [place]    Discharge Medications:     Medication List        START taking these medications      acetaminophen 500 MG tablet  Commonly known as: TYLENOL  Take 1 tablet (500 mg total) by mouth every 4 (four) hours as needed for Pain     calcium citrate-vitamin D 315-250 MG-UNIT Tabs  Commonly known as: CITRACAL+D  Take 2 tablets by mouth daily     enoxaparin 40 MG/0.4ML syringe  Commonly known as: LOVENOX  Inject 0.4 mLs (40 mg total) into the skin every 24 hours For thirty days     ferrous sulfate 324 (65 FE) MG Tbec  Take 1 tablet (324 mg total) by mouth every morning with breakfast     oxyCODONE 5 MG immediate release tablet  Commonly known as: ROXICODONE  Take 1 tablet (5 mg total) by mouth every 8 (eight) hours as needed (moderate to severe pain)     senna-docusate 8.6-50 MG per tablet  Commonly known as: PERICOLACE  Take 2 tablets by mouth 2 (two) times daily as needed for Constipation            CONTINUE taking these medications      nystatin powder  Commonly known as: NYSTOP     petrolatum ointment     senna 8.6 MG tablet  Commonly known as: SENOKOT     sertraline 50 MG tablet  Commonly known as: ZOLOFT     Vitamin B-12 2500 MCG Subl                Where to Get Your Medications        You can get these medications from any pharmacy    Bring a paper prescription for each of these medications  acetaminophen 500 MG tablet  calcium citrate-vitamin D 315-250 MG-UNIT Tabs  enoxaparin 40 MG/0.4ML syringe  ferrous sulfate 324 (65 FE) MG Tbec  oxyCODONE 5 MG immediate release tablet  senna-docusate 8.6-50 MG per tablet          Patient Lines/Drains/Airways Status       Active PICC Line / CVC Line / PIV Line / Drain / Airway / Intraosseous Line / Epidural Line / ART Line / Line / Wound / Pressure Ulcer / NG/OG Tube       Name Placement date Placement time Site Days    Peripheral IV 04/23/21 20 G Anterior;Right Forearm 04/23/21  1851  Forearm  1    External Urinary Catheter 04/22/21  --  --  3    Wound 04/22/21 Surgical Incision Hip Left 04/22/21  1908  Hip  2  Hospital Course   Presentation History   80 year old male with history of advanced Parkinson disease and advanced dementia, lives in the memory unit, had a witnessed fall and sustained left hip fracture.       See HPI for details.    Hospital Course (4 Days)   CT head and C-spine are negative, imaging showed left hip fracture, he had intertrochanteric and slightly impacted and comminuted fracture,   underwent ORIF, postoperative course has been uneventful.  Patient is a pleasantly confused, tolerating diet.  Had modest drop in hemoglobin from 13 down to 10, will be placed on p.o. iron  Patient will also be placed on oxycodone for pain control if he has moderate to severe pain, will use Tylenol for less severe pain.  Reviewed [place] PMP and no prior medications reviewed.  Prescribed oxycodone qty of 10 pills to be used as needed for severe pain. Printed rx as patient is going to [employer] with daughter, plan is if the [facility] cannot care for him and meet his PT OT needs then he will go back there, discussed with case management who is reaching out to the facility.  Otherwise patient  will go to SNF in the meanwhile  He will need to be on Lovenox subcu 40 mg daily for 30 days due to high risk of VTE post hip fracture surgery.  Discussed with daughter, no increased risk of bleeding    Patient to follow-up with Dr. Montez Morita for further management  Patient to be transferred to SNF, discussed with case manager  .    Procedures/Imaging:   XR Hip Left 1 vw without pelvis   Final Result         1.  Postsurgical changes of recent cephalomedullary nail fixation for   the intertrochanteric fracture of the left proximal femur. Alignment is   improved from prior. No evidence of immediate hardware complication.       Vassie Moment, MD    04/22/2021 9:14 PM      Fluoroscopy less than 1 hour   Final Result       Fluoroscopic guidance provided without the presence of a radiologist.       Prince Solian, MD    04/22/2021 7:46 PM      CT Head without Contrast   Final Result       1.  No intracranial hemorrhage or acute intracranial findings.   2.  Parenchymal volume loss and chronic small vessel white matter   changes, similar compared to prior.      Brenton Grills, MD    04/22/2021 9:08 AM      CT Cervical Spine without Contrast   Final Result       No acute fracture or traumatic malalignment.      Brenton Grills, MD    04/22/2021 9:05 AM      XR Chest  AP Portable   Final Result      No detectable active cardiopulmonary disease or traumatic abnormality in   the chest.      Wilmon Pali, MD    04/22/2021 8:47 AM      XR Hip left 2-3 vw with Pelvis   Final Result      1.  Acute, slightly impacted and comminuted fracture of the left   proximal femoral intertrochanteric region with mild to moderate coxa   varus deformity.      These critical results were discussed with and acknowledged by   [facility]  MILLS, MD on 04/22/2021 9:00 AM.      Candi Leash, MD    04/22/2021 9:00 AM      Femur Left AP and Lateral   Final Result      1.  Acute, slightly impacted and comminuted fracture of the left   proximal femoral intertrochanteric  region with mild to moderate coxa   varus deformity.      These critical results were discussed with and acknowledged by   Secundino Ginger, MD on 04/22/2021 9:00 AM.      Candi Leash, MD    04/22/2021 9:00 AM          Treatment Team:   Attending Provider: Drue Dun, MD               Progress Note/Physical Exam at Discharge     Subjective: Pleasantly confused, is eating better today.  Patient needs assistance with feeding      Vitals:    04/26/21 0100 04/26/21 0522 04/26/21 0738 04/26/21 1127   BP: 107/57 102/61 120/64 90/58   Pulse: 69 70 69 (!) 54   Resp: 17 17 16 18    Temp: 97.3 F (36.3 C) 97.3 F (36.3 C) 97.5 F (36.4 C) 97.5 F (36.4 C)   TempSrc: Oral Oral Oral Oral   SpO2: 96% 93% 97% 94%   Weight:       Height:           General: NAD, AAOx0  HEENT: perrla, eomi, sclera anicteric, OP: Clear, MMM  Neck: supple, FROM, no LAD  Cardiovascular: RRR, no m/r/g  Lungs: CTAB, no w/r/r  Abdomen: soft, +BS, NT/ND, no masses, no g/r  Extremities: LLE dressing C/D /D. Thigh not tense  Neuro: CN 2-12 intact; No Focal neurological deficits           Diagnostics     Labs/Studies Pending at Discharge: No    Last Labs   Recent Labs   Lab 04/24/21  0527 04/23/21  0440 04/22/21  0742   WBC 7.45 10.45* 6.01   RBC 3.22* 3.66* 4.35   Hgb 10.0* 11.4* 13.5   Hematocrit 29.7* 33.5* 39.2   MCV 92.2 91.5 90.1   Platelets 222 271 283         Recent Labs   Lab 04/24/21  0527 04/23/21  0440 04/22/21  0742   Sodium 140 141 138   Potassium 3.9 4.3 3.9   Chloride 107 107 107   CO2 25 22 20    BUN 13.0 14.0 17.0   Creatinine 0.8 1.0 0.9   Glucose 116* 183* 118*   Calcium 8.2 8.5 9.2   Magnesium  --  1.6  --          Microbiology Results (last 15 days)       Procedure Component Value Units Date/Time    COVID-19 (SARS-CoV-2) only ([facility]iat Rapid) asymptomatic admission - Hospitals [[id number]] Collected: 04/22/21 1015    Order Status: Completed Specimen: Nasopharyngeal Updated: 04/22/21 1105     Purpose of COVID testing Screening      SARS-CoV-2 Specimen Source Nasal Swab     SARS CoV 2 Overall Result Not Detected     Comment: __________________________________________________  -A result of "Detected" indicates POSITIVE for the    presence of SARS CoV-2 RNA  -A result of "Not Detected" indicates NEGATIVE for the    presence of SARS CoV-2 RNA  __________________________________________________________  Test performed using the Roche cobas Liat SARS-CoV-2 assay. This assay is  only for use under the Food and Drug Administration's Emergency Use  Authorization. This is a real-time RT-PCR assay for the qualitative  detection of SARS-CoV-2 RNA. Viral nucleic acids may persist in vivo,  independent of viability. Detection of viral nucleic acid does not imply the  presence of infectious virus, or that virus nucleic acid is the cause of  clinical symptoms. Negative results do not preclude SARS-CoV-2 infection and  should not be used as the sole basis for diagnosis, treatment or other  patient management decisions. Negative results must be combined with  clinical observations, patient history, and/or epidemiological information.  Invalid results may be due to inhibiting substances in the specimen and  recollection should occur. Please see Fact Sheets for patients and providers  located:  WirelessDSLBlog.no         Narrative:      o Collect and clearly label specimen type:  o PREFERRED-Upper respiratory specimen: One Nasal Swab in  Transport Media.  o Hand deliver to laboratory ASAP  Indication for testing->Extended care facility admission to  semi private room  Screening    Urine culture [[id number]] Collected: 04/22/21 0757    Order Status: Completed Specimen: Bladder Updated: 04/23/21 1457    Narrative:      ORDER#: [id number]                                    ORDERED BY: MILLS, CHRISTOP  SOURCE: Urine                                        COLLECTED:  04/22/21 07:57  ANTIBIOTICS AT COLL.:                                 RECEIVED :  04/22/21 08:04  Culture Urine                              FINAL       04/23/21 14:57  04/23/21   No growth of >1,000 CFU/ML, No further work               Patient Instructions   Discharge Diet: regular diet  Discharge Activity:  activity as tolerated    Follow Up Appointment:   Follow-up Information       Theressa Millard, MD Follow up.    Specialty: Orthopaedic Surgery  Contact information:  [address]  [place] [address]  [phone]               Behiri, Amr H, DO Follow up in 1 week(s).    Specialty: Internal Medicine  Contact information:  [address]  [place] [address]  [phone]                              Time spent examining patient, discussing with patient/family regarding hospital course, chart review, reconciling medications and discharge planning: 40 minutes.    Signed,  Drue Dun, MD  2:20 PM 04/26/2021     This note was generated by the [facility] EMR system/Dragon speech recognition and may contain inherent errors or omissions not intended by the user. Grammatical  errors, random word insertions, deletions, pronoun errors and incomplete sentences are occasional consequences of this technology due to software limitations. Not all errors are caught or corrected. If there are questions or concerns about the content of this note or information contained within the body of this dictation they should be addressed directly with the author for clarification.

## 2021-05-11 ENCOUNTER — Encounter (FREE_STANDING_LABORATORY_FACILITY): Payer: Medicare Other

## 2021-05-11 DIAGNOSIS — G20 Parkinson's disease: Secondary | ICD-10-CM

## 2021-05-11 LAB — CBC
Absolute NRBC: 0 10*3/uL (ref 0.00–0.00)
Hematocrit: 38.9 % (ref 37.6–49.6)
Hgb: 12.4 g/dL — ABNORMAL LOW (ref 12.5–17.1)
MCH: 31.4 pg (ref 25.1–33.5)
MCHC: 31.9 g/dL (ref 31.5–35.8)
MCV: 98.5 fL — ABNORMAL HIGH (ref 78.0–96.0)
MPV: 10.9 fL (ref 8.9–12.5)
Nucleated RBC: 0 /100 WBC (ref 0.0–0.0)
Platelets: 546 10*3/uL — ABNORMAL HIGH (ref 142–346)
RBC: 3.95 10*6/uL — ABNORMAL LOW (ref 4.20–5.90)
RDW: 15 % (ref 11–15)
WBC: 6.23 10*3/uL (ref 3.10–9.50)

## 2021-08-31 ENCOUNTER — Emergency Department
Admission: EM | Admit: 2021-08-31 | Discharge: 2021-08-31 | Disposition: A | Discharge status: Skilled Nursing Facility | Payer: Medicare Other | Attending: Emergency Medicine | Admitting: Emergency Medicine

## 2021-08-31 ENCOUNTER — Emergency Department: Payer: Medicare Other

## 2021-08-31 DIAGNOSIS — R29810 Facial weakness: Secondary | ICD-10-CM | POA: Insufficient documentation

## 2021-08-31 DIAGNOSIS — R079 Chest pain, unspecified: Secondary | ICD-10-CM

## 2021-08-31 LAB — COMPREHENSIVE METABOLIC PANEL
ALT: 16 U/L (ref 0–55)
AST (SGOT): 18 U/L (ref 5–41)
Albumin/Globulin Ratio: 1.4 (ref 0.9–2.2)
Albumin: 3.6 g/dL (ref 3.5–5.0)
Alkaline Phosphatase: 129 U/L — ABNORMAL HIGH (ref 37–117)
Anion Gap: 7 (ref 5.0–15.0)
BUN: 17 mg/dL (ref 9.0–28.0)
Bilirubin, Total: 0.5 mg/dL (ref 0.2–1.2)
CO2: 27 mEq/L (ref 17–29)
Calcium: 9.2 mg/dL (ref 7.9–10.2)
Chloride: 107 mEq/L (ref 99–111)
Creatinine: 0.9 mg/dL (ref 0.5–1.5)
Globulin: 2.6 g/dL (ref 2.0–3.6)
Glucose: 111 mg/dL — ABNORMAL HIGH (ref 70–100)
Potassium: 4.1 mEq/L (ref 3.5–5.3)
Protein, Total: 6.2 g/dL (ref 6.0–8.3)
Sodium: 141 mEq/L (ref 135–145)

## 2021-08-31 LAB — CBC AND DIFFERENTIAL
Absolute NRBC: 0 10*3/uL (ref 0.00–0.00)
Basophils Absolute Automated: 0.04 10*3/uL (ref 0.00–0.08)
Basophils Automated: 0.7 %
Eosinophils Absolute Automated: 0.05 10*3/uL (ref 0.00–0.44)
Eosinophils Automated: 0.9 %
Hematocrit: 38.9 % (ref 37.6–49.6)
Hgb: 13 g/dL (ref 12.5–17.1)
Immature Granulocytes Absolute: 0.03 10*3/uL (ref 0.00–0.07)
Immature Granulocytes: 0.5 %
Instrument Absolute Neutrophil Count: 3.8 10*3/uL (ref 1.10–6.33)
Lymphocytes Absolute Automated: 1.04 10*3/uL (ref 0.42–3.22)
Lymphocytes Automated: 19 %
MCH: 30.4 pg (ref 25.1–33.5)
MCHC: 33.4 g/dL (ref 31.5–35.8)
MCV: 91.1 fL (ref 78.0–96.0)
MPV: 10.3 fL (ref 8.9–12.5)
Monocytes Absolute Automated: 0.52 10*3/uL (ref 0.21–0.85)
Monocytes: 9.5 %
Neutrophils Absolute: 3.8 10*3/uL (ref 1.10–6.33)
Neutrophils: 69.4 %
Nucleated RBC: 0 /100 WBC (ref 0.0–0.0)
Platelets: 257 10*3/uL (ref 142–346)
RBC: 4.27 10*6/uL (ref 4.20–5.90)
RDW: 15 % (ref 11–15)
WBC: 5.48 10*3/uL (ref 3.10–9.50)

## 2021-08-31 LAB — PT AND APTT
PT INR: 0.9 (ref 0.9–1.1)
PT: 10.6 s (ref 10.1–12.9)
PTT: 22 s — ABNORMAL LOW (ref 27–39)

## 2021-08-31 LAB — GLUCOSE WHOLE BLOOD - POCT: Whole Blood Glucose POCT: 91 mg/dL (ref 70–100)

## 2021-08-31 LAB — ECG 12-LEAD
P Axis: 66 degrees
P-R Interval: 138 ms
QRS Duration: 88 ms
T Axis: 55 degrees

## 2021-08-31 LAB — GFR: EGFR: >60

## 2021-08-31 LAB — HIGH SENSITIVITY TROPONIN-I: hs Troponin-I: 5.4 ng/L

## 2021-08-31 NOTE — ED Triage Notes (Signed)
Patient BIBA for L side facial droop, per EMS LKW was 1430 and L side facial droop noted at 1630. Per EMS patient is non-verbal at baseline w/ hx of dementia. Patient withdrawing equaly from pain and not following commands. No facial droop seen on arrival, patient unable to follow commands or respond to questions. Eyes PEERLA, patient tracking. EMS placed 18g to LAC, dexi 118. Patient appears drowsy but easily aroused. Skin warm and dry. patient appears to be in no acute distress. MD Missaghi at bedside to evaluate patient and in agreement no facial droop noted. Daughter now at bedside and stating patient is at baseline.

## 2021-08-31 NOTE — ED Notes (Signed)
MMT here to get patient at this time. All required paperwork and discharge paperwork given to MMT to be given to facility.

## 2021-08-31 NOTE — ED Provider Notes (Signed)
[facility] EMERGENCY DEPARTMENT  ATTENDING PHYSICIAN HISTORY AND PHYSICAL EXAM     Patient Name: Danny Sutton, Danny Sutton  Encounter Date:  08/31/2021  Attending Physician: Roselyn Reef MD  Room:  N 36/N 36  Patient DOB:  [birth year]  Age: 81 y.o. male  MRN:  [id number]  PCP: Kerin Perna, DO         Diagnosis/Disposition:     Final Impression  Final diagnoses:   Facial droop     Disposition  ED Disposition       ED Disposition   Discharge    Condition   --    Date/Time   Wed Aug 31, 2021  8:05 PM    Comment   Teressa Lower discharge to home/self care.    Condition at disposition: Stable               Follow up  Kerin Perna, DO  [address]  [place] [address]  [phone]    In 2 days      Prescriptions  New Prescriptions    No medications on file             MDM:      Number and Complexity of Problems - High    Comorbidities impacting treatment: [HTN, Heart Murmur, Dementia]    Social Determinants of Health that impact treatment or disposition: [Nonverbal, nursing home patient]    MDM Data    Independent historian: [Hx per daughter, RN]    External documents reviewed: Performed Chart Reviewed    My EKG interpretation: see below      My CT interpretation: See ED Course      Tests considered but not ordered: [CTA head and neck, MRI brain]    Discussed with: See ED Course    Treatment and Disposition    Prescription drug management considered: [Aspirin]    Hospitalization considered: [see below]    Shared decision making: Performed with daughter (medical decision maker)    Code status: [DNR/DNI]    ED Course: 1744: Patient presenting with possible stroke.  Patient evaluated.  History is extremely limited.  Possible last known well time at 1430.  Patient without any appreciated focal neurodeficits or facial droop though exam is very difficult due to patient condition.  At this time code stroke not initiated given no appreciable neurodeficits.    1840: Daughter at bedside who is Museum/gallery exhibitions officer.  I had a long talk with  the daughter about patient's condition.  She wants to discuss it with her brother but she is leaning towards not admitting patient regardless of the results.    2015: Vital stable.  Patient is afebrile and well-appearing.  Patient without any vomiting in the ER.  Patient without any focal neurodeficits.  Patient appears to be much more alert and awake with the daughter bedside.  Patient observed for prolonged period time without any complications.  CBC and chemistry unremarkable.  High-sensitivity troponin negative, doubt ACS.  UA pending however after discussing with the daughter she would like to have patient be discharged prior to getting the urine result.  Patient without any fever or change in mental status so doubt UTI.  CT of the head shows no acute pathology.  Patient presenting with possible facial droop though based on history and exam I suspect that the reported facial droop was more related to patient's sleepiness and body position which was tilted to the left as well as loose skin.  Patient never had any identifiable neurodeficits  or facial drooping throughout ER visit.  Plan was initially to admit patient for observation and possible TIA work-up however after having a long discussion with the daughter as well as the son they would like patient to be discharged back to the nursing facility.  I explained to both of them the risk and benefits of admission versus discharge including the risk of possible stroke which could lead to death.  After discussing the risk and benefits shared decision making was performed and family feels that it is to the benefit of the patient to not be admitted for hospitalization given his baseline condition and DNR/DNI status.  Patient will be discharged back to the nursing facility via transport.  Strict return precautions were given.  Family agrees with plan.  Doubt aortic dissection.  Doubt sepsis.  I also offered to obtain a CT angiogram of the head and neck or MRI but  family declined.    The patient's past medical records, including those in Care Everywhere when necessary, were reviewed by me.    This patient was seen and evaluated during the SARS-CoV-2 pandemic.The following personal protective equipment was used by me in the care of this patient: Disposable face mask            History of Presenting Illness:     Nursing Triage note: Patient BIBA for L side facial droop, per EMS LKW was 1430 and L side facial droop noted at 1630. Per EMS patient is non-verbal at baseline w/ hx of dementia. Patient withdrawing equaly from pain and not following commands. R facial droop seen on arrival, patient unable to follow commands or respond to questions. Eyes PEERLA, patient tracking. EMS placed 18g to LAC, dexi 118. Patient appears drowsy but easily aroused. Skin warm and dry. patient appears to be in no acute distress.    Chief complaint: Facial Droop    HPI  Danny Sutton is a 81 y.o. male w/PMH of Alzheimer's, dementia, heart murmur, HTN, prostate cancer who presents with possible stroke.  History as per RN as patient is nonverbal and EMS is no longer present.  Per RN Per EMS patient was last seen normal at 2:30 PM at the nursing facility.  At baseline patient is nonambulatory and nonverbal and severely demented and DNR.  Per EMS when staff went to go check on patient around 1630 patient was noted to have a possible left-sided facial droop.  So last known well time was 1430.  Patient unable to give any history.        Review of Systems:  Physical Exam:     Review of Systems    Unable to obtain review of systems due to patient condition    Pulse (!) 53  BP 119/63  Resp 19  SpO2 97 %  Temp 98.1 F (36.7 C)     General: NAD, alert, awake, keeps eyes closed the entire time except for painful stimuli  HENT: Normocephalic, no signs of dehydration, throat clear, oral mucous membranes moist,  Eyes: EOMI, PERRL, no scleral icterus, no conjunctival pallor  Neck: Normal inspection, no masses,  supple, no meningeal signs  Respiration: No respiratory distress, breath sounds normal, lungs clear   Cardiovascular: RRR, Radial pulses symmetric, pulses normal, heart sounds normal without murmurs  Abdomen: Soft,non-tender, non-distended, no pulsatile masses  Back: Normal inspection, no CVA tenderness  Skin: Normal color, warm, dry, no rash noted  Extremities: Normal appearance, full ROM,   Psych: Unable to assess  Neuro: Does not follow  commands, normal gag reflex, patient wrinkles his forehead bilateral when causing painful stimuli, able to have patient open his mouth with gag reflex with what appears to be normal facial symmetry bilateral, no unilateral facial droop noted, moves all 4 extremities to painful stimuli but does not follow commands, able to hold bilateral upper extremities against gravity, bilateral lower extremities are rigid but able to hold up against gravity for a few seconds        Diagnostic Results:     Laboratory Studies:    All lab values have been personally reviewed by me    Results       Procedure Component Value Units Date/Time    PT/APTT [[id number]]  (Abnormal) Collected: 08/31/21 1846     Updated: 08/31/21 1932     PT 10.6 sec      PT INR 0.9     PTT 22 sec     High Sensitivity Troponin-I [[id number]] Collected: 08/31/21 1846    Specimen: Blood Updated: 08/31/21 1931     hs Troponin-I 5.4 ng/L     Comprehensive metabolic panel [[id number]]  (Abnormal) Collected: 08/31/21 1846    Specimen: Blood Updated: 08/31/21 1921     Glucose 111 mg/dL      BUN 32.4 mg/dL      Creatinine 0.9 mg/dL      Sodium 401 mEq/L      Potassium 4.1 mEq/L      Chloride 107 mEq/L      CO2 27 mEq/L      Calcium 9.2 mg/dL      Protein, Total 6.2 g/dL      Albumin 3.6 g/dL      AST (SGOT) 18 U/L      ALT 16 U/L      Alkaline Phosphatase 129 U/L      Bilirubin, Total 0.5 mg/dL      Globulin 2.6 g/dL      Albumin/Globulin Ratio 1.4     Anion Gap 7.0    GFR [[id number]] Collected: 08/31/21 1846     Updated: 08/31/21  1921     EGFR >60.0       CBC and differential [[id number]] Collected: 08/31/21 1846    Specimen: Blood Updated: 08/31/21 1907     WBC 5.48 x10 3/uL      Hgb 13.0 g/dL      Hematocrit 56.3 %      Platelets 257 x10 3/uL      RBC 4.27 x10 6/uL      MCV 91.1 fL      MCH 30.4 pg      MCHC 33.4 g/dL      RDW 15 %      MPV 10.3 fL      Instrument Absolute Neutrophil Count 3.80 x10 3/uL      Neutrophils 69.4 %      Lymphocytes Automated 19.0 %      Monocytes 9.5 %      Eosinophils Automated 0.9 %      Basophils Automated 0.7 %      Immature Granulocytes 0.5 %      Nucleated RBC 0.0 /100 WBC      Neutrophils Absolute 3.80 x10 3/uL      Lymphocytes Absolute Automated 1.04 x10 3/uL      Monocytes Absolute Automated 0.52 x10 3/uL      Eosinophils Absolute Automated 0.05 x10 3/uL      Basophils Absolute Automated 0.04 x10 3/uL      Immature Granulocytes  Absolute 0.03 x10 3/uL      Absolute NRBC 0.00 x10 3/uL     Glucose Whole Blood - POCT [[id number]] Collected: 08/31/21 1808     Updated: 08/31/21 1811     Whole Blood Glucose POCT 91 mg/dL             Radiology Studies:    All images have been personally viewed by me    CT Head WO Contrast   Final Result          1. No CT evidence of acute intracranial hemorrhage, herniation or   hydrocephalus.         2. Cerebral volume loss and sequela of chronic ischemic disease.      Neldon Mc, MD   08/31/2021 6:28 PM              Interpretations, Clinical Decision Tools and Critical Care:     O2 Sat:  The patient's oxygen saturation was 97 % on room air. This was independently interpreted by me as Normal.   Cardiac Monitoring: I independely reviewed and interpreted the patient's cardiac monitoring as normal sinus at 55.    As interpreted by me:  Time of EKG:  Sinus bradycardia 57 bpm  Some artifact  Meets LVH criteria  Normal intervals  Normal axis  No acute ST or T wave changes          Procedures:   Procedures        Orders Placed During This Visit:     Encounter Orders:  Orders Placed  This Encounter   Procedures    CT Head WO Contrast    CBC and differential    Comprehensive metabolic panel    PT/APTT    High Sensitivity Troponin-I    Urinalysis Reflex to Microscopic Exam- Reflex to Culture    GFR    Glucose POC    Glucose Whole Blood - POCT    ECG 12 Lead       Encounter Medications:  Medications - No data to display        Allergies & Medications:     Allergies:  Hehas No Known Allergies.    Home Medications       Med List Status: In Progress Set By: Gaspar Skeeters, RN at 08/31/2021  5:28 PM              acetaminophen (TYLENOL) 500 MG tablet     Take 1 tablet (500 mg total) by mouth every 4 (four) hours as needed for Pain     calcium citrate-vitamin D (CITRACAL+D) 315-250 MG-UNIT Tab     Take 2 tablets by mouth daily     Cyanocobalamin (Vitamin B-12) 2500 MCG SL Tab     Place 2,500 mcg under the tongue        enoxaparin (LOVENOX) 40 MG/0.4ML syringe     Inject 0.4 mLs (40 mg total) into the skin every 24 hours For thirty days     ferrous sulfate 324 (65 FE) MG Tablet Delayed Response     Take 1 tablet (324 mg total) by mouth every morning with breakfast     nystatin (NYSTOP) powder     Apply topically as needed Groin     petrolatum (AQUAPHOR) ointment     Apply topically as needed     senna (SENOKOT) 8.6 MG tablet     Take 1 tablet by mouth daily as needed for Constipation     senna-docusate (PERICOLACE) 8.6-50 MG per  tablet     Take 2 tablets by mouth 2 (two) times daily as needed for Constipation     sertraline (ZOLOFT) 50 MG tablet     Take 100 mg by mouth daily                 Past History:     Medical:   Past Medical History:   Diagnosis Date    Alzheimer disease     Dementia     Heart murmur     Hypertension     Prostate cancer     Prostate cancer     Prostate cancer        Surgical: He has a past surgical history that includes Prostate surgery and OPEN REDUCTION, HIP, INSERTION IM NAIL (GAMMA) (Left, 04/22/2021).    Family:   Family History   Problem Relation Age of Onset    No known  problems Mother        Social: He reports that he has never smoked. He has never used smokeless tobacco. He reports that he does not drink alcohol and does not use drugs.        ATTESTATIONS     Roselyn Reef MD    Scribe Attestation:    I am the first provider for this patient and I personally performed the services documented. Treatment Team: Corwin Levins E is scribing for me on this chart. This note and the patient instructions accurately reflect work and decisions made by me.      Documentation Notes:    Parts of this note were generated by the Epic EMR system/ Dragon speech recognition and may contain inherent errors or omissions not intended by the user. Grammatical errors, random word insertions, deletions, pronoun errors and incomplete sentences are occasional consequences of this technology due to software limitations. Not all errors are caught or corrected.    My documentation is often completed after the patient is no longer under my clinical care. In some cases, the Epic EMR may pull updated results into the above documentation which may not reflect all results or information that was available to me at the time of my medical decision making.     If there are questions or concerns about the content of this note or information contained within the body of this dictation they should be addressed directly with the author for clarification.                    Roselyn Reef, MD  08/31/21 2018

## 2021-08-31 NOTE — Discharge Instructions (Signed)
Dear Mr. Swailes:    Thank you for choosing the [facility] Emergency Department, the premier emergency department in the [place] area.  I hope your visit today was EXCELLENT. You will receive a survey via text message that will give you the opportunity to provide feedback to your team about your visit. Please do not hesitate to reach out with any questions!    Specific instructions for your visit today:    Please see your doctor in 1 to 2 days.  Please return to the ER immediately if develop new or worsening symptoms.  Please note that you were primarily tested for emergency conditions today in the emergency department.  We may occasionally find incidental findings on imaging or laboratory test that may not be considered emergent. It is important to follow-up with your primary care doctor and request your records as an outpatient so your primary care doctor may review them with you to ensure that these findings are reviewed and addressed.     IF YOU DO NOT CONTINUE TO IMPROVE OR YOUR CONDITION WORSENS, PLEASE CONTACT YOUR DOCTOR OR RETURN IMMEDIATELY TO THE EMERGENCY DEPARTMENT.    Sincerely,  Missaghi, Babak, MD  Attending Emergency Physician  [facility] Emergency Department      OBTAINING A PRIMARY CARE APPOINTMENT    Primary care physicians (PCPs, also known as primary care doctors) are either internists or family medicine doctors. Both types of PCPs focus on health promotion, disease prevention, patient education and counseling, and treatment of acute and chronic medical conditions.    If you need a primary care doctor, please call the below number and ask who is receiving new patients.     [facility]  Telephone:  [phone]  [email or web address]    DOCTOR REFERRALS  Call [phone] (available 24 hours a day, 7 days a week) if you need any further referrals and we can help you find a primary care doctor or specialist.  Also, available online at:   [email or web address]    YOUR CONTACT INFORMATION  Before leaving please check with registration to make sure we have an up-to-date contact number.  You can call registration at [phone] to update your information.  For questions about your hospital bill, please call [phone].  For questions about your Emergency Dept Physician bill please call [phone].      FREE HEALTH SERVICES  If you need help with health or social services, please call 2-1-1 for a free referral to resources in your area.  2-1-1 is a free service connecting people with information on health insurance, free clinics, pregnancy, mental health, dental care, food assistance, housing, and substance abuse counseling.  Also, available online at:  http://www.[facility].org    ORTHOPEDIC INJURY   Please know that significant injuries can exist even when an initial x-ray is read as normal or negative.  This can occur because some fractures (broken bones) are not initially visible on x-rays.  For this reason, close outpatient follow-up with your primary care doctor or bone specialist (orthopedist) is required.    MEDICATIONS AND FOLLOWUP  Please be aware that some prescription medications can cause drowsiness.  Use caution when driving or operating machinery.    The examination and treatment you have received in our Emergency Department is provided on an emergency basis, and is not intended to be a substitute for your primary care physician.  It is important that your doctor checks you again and that you report any new or remaining problems at that  time.      ASSISTANCE WITH INSURANCE    Affordable Care Act  (ACA)  Call to start or finish an application, compare plans, enroll or ask a question.  1-800-318-2596  TTY: 1-855-889-4325  Web:  Healthcare.gov    Help Enrolling in Medicaid  [facility] [place]  (855) 242-8282 (TOLL-FREE)  (888) 221-1590 (TTY)  Web:  Http://www.coverva.org    Local Help Enrolling in the ACA  [facility]  ([phone] 748-2580 (MAIN)  Email:  health-help@nvfs.org  Web:  Http://www.nvfs.org  Address:  [address], [address] [place], [place] [address]    SEDATING MEDICATIONS  Sedating medications include strong pain medications (e.g. narcotics), muscle relaxers, benzodiazepines (used for anxiety and as muscle relaxers), Benadryl/diphenhydramine and other antihistamines for allergic reactions/itching, and other medications.  If you are unsure if you have received a sedating medication, please ask your physician or nurse.  If you received a sedating medication: DO NOT drive a car. DO NOT operate machinery. DO NOT perform jobs where you need to be alert.  DO NOT drink alcoholic beverages while taking this medicine.     If you get dizzy, sit or lie down at the first signs. Be careful going up and down stairs.  Be extra careful to prevent falls.     Never give this medicine to others.     Keep this medicine out of reach of children.     Do not take or save old medicines. Throw them away when outdated.     Keep all medicines in a cool, dry place. DO NOT keep them in your bathroom medicine cabinet or in a cabinet above the stove.    MEDICATION REFILLS  Please be aware that we cannot refill any prescriptions through the ER. If you need further treatment from what is provided at your ER visit, please follow up with your primary care doctor or your pain management specialist.    FREESTANDING EMERGENCY DEPARTMENTS OF [facility]  Did you know [facility] has two freestanding ERs located just a few miles away?  [facility] [place] and [facility] [place] have short wait times, easy free parking directly in front of the building and top patient satisfaction scores - and the same Board Certified Emergency Medicine doctors as [facility].

## 2021-09-01 LAB — ECG 12-LEAD
Atrial Rate: 57 {beats}/min
Q-T Interval: 442 ms
QTC Calculation (Bezet): 430 ms
R Axis: 26 degrees
Ventricular Rate: 57 {beats}/min

## 2021-11-13 ENCOUNTER — Emergency Department: Payer: Medicare Other

## 2021-11-13 ENCOUNTER — Emergency Department
Admission: EM | Admit: 2021-11-13 | Discharge: 2021-11-13 | Disposition: A | Discharge status: Nursing Home (Includes ICF) | Payer: Medicare Other | Attending: Emergency Medical Services | Admitting: Emergency Medical Services

## 2021-11-13 DIAGNOSIS — S0001XA Abrasion of scalp, initial encounter: Secondary | ICD-10-CM

## 2021-11-13 DIAGNOSIS — S0990XA Unspecified injury of head, initial encounter: Secondary | ICD-10-CM

## 2021-11-13 DIAGNOSIS — Z23 Encounter for immunization: Secondary | ICD-10-CM | POA: Insufficient documentation

## 2021-11-13 DIAGNOSIS — W19XXXA Unspecified fall, initial encounter: Secondary | ICD-10-CM | POA: Insufficient documentation

## 2021-11-13 MED ORDER — TETANUS-DIPHTH-ACELL PERTUSSIS 5-2.5-18.5 LF-MCG/0.5 IM SUSP/SUSY (WR)
0.5000 mL | Freq: Once | INTRAMUSCULAR | Status: AC
Start: 2021-11-13 — End: 2021-11-13
  Administered 2021-11-13: 0.5 mL via INTRAMUSCULAR
  Filled 2021-11-13: qty 0.5

## 2021-11-13 NOTE — Progress Notes (Signed)
Rec'd call from Rich, RN that pt requires w/c [facility]ena[facility] Niece transport back to [facility] at [facility].  [address] [place], [place] [address]. CM spoke with pt dghtr Darl Pikes who agrees to private pay.  Dghtr states pt has used w/c Zenaida Niece in past.  Environmental consultant [phone]. Confirmed Rm P8 in [place] wing with dghtr. CM contacted [facility] for w/c Zenaida Niece transport.  ETA:  1100 pick up.  $100.  RN Rich aware and will communicate to dghtr at bedside.    Everardo Pacific, BSN, RN CEN  Case Manager  [facility]  [phone]

## 2021-11-13 NOTE — ED Notes (Signed)
Bed: A04  Expected date:   Expected time:   Means of arrival:   Comments:  440 eta [phone]

## 2021-11-13 NOTE — ED Notes (Signed)
Pt report called to [facility], LPN at the [facility]. [phone].

## 2021-11-13 NOTE — Discharge Instructions (Signed)
Thank you for choosing [facility] for your emergency care needs.   We strive to provide EXCELLENT care to you and your family.     IF YOU DO NOT CONTINUE TO IMPROVE OR YOUR CONDITION WORSENS, PLEASE CONTACT YOUR DOCTOR OR RETURN IMMEDIATELY TO THE EMERGENCY DEPARTMENT.     DOCTOR REFERRALS   Call [phone] (available 24 hours a day, 7 days a week) if you need any further referrals and we can help you find a primary care doctor or specialist. Also, available online at: [email or web address]   YOUR CONTACT INFORMATION   Before leaving please check with registration to make sure we have an up-to-date contact number. You can call registration at [phone] to update your information. For questions about your hospital bill, please call [phone]. For questions about your Emergency Dept Physician bill please call [phone].   FREE HEALTH SERVICES   If you need help with health or social services, please call 2-1-1 for a free referral to resources in your area. 2-1-1 is a free service connecting people with information on health insurance, free clinics, pregnancy, mental health, dental care, food assistance, housing, and substance abuse counseling. Also, available online at: http://www.[facility].org   MEDICAL RECORDS AND TESTS   Certain laboratory test results do not come back the same day, for example urine cultures. We will contact you if other important findings are noted. Radiology films are often reviewed again to ensure accuracy. If there is any discrepancy, we will notify you.   Please call [phone] to pick up a complimentary CD of any radiology studies performed. If you or your doctor would like to request a copy of your medical records, please call [phone].   ORTHOPEDIC INJURY   Please know that significant injuries can exist even when an initial x-ray is read as normal or negative. This can occur because some fractures (broken bones) are not  initially visible on x-rays. For this reason, close outpatient follow-up with your primary care doctor or bone specialist (orthopedist) is required.   MEDICATIONS AND FOLLOWUP   Please be aware that some prescription medications can cause drowsiness. Use caution when driving or operating machinery.   The examination and treatment you have received in our Emergency Department is provided on an emergency basis, and is not intended to be a substitute for your primary care physician. It is important that your doctor checks you again and that you report any new or remaining problems at that time.   24 HOUR PHARMACIES  [facility] - [address], [place], [place] [address] (1.4 miles, 7 minutes)   [facility] - [address], [place], [place] [address] (6.5 miles, 13 minutes)   Handout with directions available on request.

## 2021-11-13 NOTE — ED Provider Notes (Signed)
[facility]    EMERGENCY DEPARTMENT HISTORY AND PHYSICAL EXAM    Date: 11/13/2021  Patient Name: Danny Sutton  Attending Physician: No att. providers found  Patient DOB:  Jul 15, 1940  MRN:  [id number]  Room:  S[facility] 1/S[facility] 1    Patient was evaluated by ED physician, Dr Danny Sutton, at 8:22 AM    History     Chief Complaint   Patient presents with    Fall          The patient Danny Sutton, is a 81 y.o. male who presents with chief complaint of complains of unwitnessed fall after last being seen at 6:30 AM.  He was found down at 7:30 AM with an abrasion to his left occipital scalp.  He is at his baseline in terms of mental status where he is aphasic and has history of dementia and Parkinson's according to medics.  Blood sugar is 105.  Vital signs are stable.  No other injuries are noted.  He is not on blood thinners    PCP:  Behiri, Amr H, DO      Past Medical History       Past Medical History:   Diagnosis Date    Alzheimer disease     Dementia     Heart murmur     Hypertension     Prostate cancer     Prostate cancer     Prostate cancer          Past Surgical History       Past Surgical History:   Procedure Laterality Date    OPEN REDUCTION, HIP, INSERTION IM NAIL (GAMMA) Left 04/22/2021    Procedure: OPEN REDUCTION, HIP, INSERTION IM NAIL (GAMMA);  Surgeon: Danny Millard, MD;  Location: [facility];  Service: Orthopedics;  Laterality: Left;    PROSTATE SURGERY           Family History    Family History   Problem Relation Age of Onset    No known problems Mother        Social History    Social History     Socioeconomic History    Marital status: Married     Spouse name: None    Number of children: None    Years of education: None    Highest education level: None   Occupational History    None   Tobacco Use    Smoking status: Never    Smokeless tobacco: Never   Vaping Use    Vaping status: Never Used   Substance and Sexual Activity    Alcohol use: No    Drug use: No    Sexual activity: None    Other Topics Concern    None   Social History Narrative    None     Social Determinants of Health     Financial Resource Strain: Not on file   Food Insecurity: Not on file   Transportation Needs: Not on file   Physical Activity: Not on file   Stress: Not on file   Social Connections: Not on file   Intimate Partner Violence: Not on file   Housing Stability: Not on file       Allergies    No Known Allergies      Current/Home Medications    Discharge Medication List as of 11/13/2021 11:08 AM        CONTINUE these medications which have NOT CHANGED    Details   acetaminophen (TYLENOL)  500 MG tablet Take 1 tablet (500 mg total) by mouth every 4 (four) hours as needed for Pain, Starting Mon 04/25/2021, Print      calcium citrate-vitamin D (CITRACAL+D) 315-250 MG-UNIT Tab Take 2 tablets by mouth daily, Starting Mon 04/25/2021, Print      Cyanocobalamin (Vitamin B-12) 2500 MCG SL Tab Place 2,500 mcg under the tongue   , Historical Med      enoxaparin (LOVENOX) 40 MG/0.4ML syringe Inject 0.4 mLs (40 mg total) into the skin every 24 hours For thirty days, Starting Mon 04/25/2021, Print      ferrous sulfate 324 (65 FE) MG Tablet Delayed Response Take 1 tablet (324 mg total) by mouth every morning with breakfast, Starting Mon 04/25/2021, Print      nystatin (NYSTOP) powder Apply topically as needed Groin, Historical Med      petrolatum (AQUAPHOR) ointment Apply topically as needed, Historical Med      senna (SENOKOT) 8.6 MG tablet Take 1 tablet by mouth daily as needed for Constipation, Historical Med      senna-docusate (PERICOLACE) 8.6-50 MG per tablet Take 2 tablets by mouth 2 (two) times daily as needed for Constipation, Starting Mon 04/25/2021, Print      sertraline (ZOLOFT) 50 MG tablet Take 100 mg by mouth daily, Starting Mon 03/11/2018, Historical Med             Vital Signs     BP 126/69   Pulse 61   Temp 97.9 F (36.6 C)   Resp 16   Wt 62.1 kg   SpO2 98%   BMI 19.09 kg/m   No data found.      Review of Systems      Review of Systems   Unable to perform ROS: Dementia       Physical Exam          CONSTITUTIONAL   Vital signs reviewed, Patient appears comfortable, Alert not oriented aphasic moving all extremities  HEAD   Atraumatic, Normocephalic.  Abrasion to the left occipital scalp without laceration is noted  EYES   Eyes are normal to inspection, Pupils equal, round and reactive to light, No discharge from eyes, Extraocular muscles intact, Sclera are normal, Conjunctiva normal.  ENT Mouth normal to Inspection.  NECK   Normal ROM. No jugular venous distention, No meningeal signs.  RESPIRATORY Chest is nontender, Breath sounds normal, No respiratory distress  CARDIOVASCULAR   RRR, No murmurs, Normal S1 S2, Rhythm is normal.  ABDOMEN   Abdomen is nontender, No masses, Bowel sounds normal, No distension, No peritoneal signs.  BACK  There is no CVA Tenderness, Normal inspection.  UPPER EXTREMITY   Inspection normal, No cyanosis, No edema.  LOWER EXTREMITY   Inspection normal, No cyanosis, No edema.  NEURO 5/5 ms and Nl sensation B. No cerebellar deficits. Nl cranial exam except aphasic from dementia  SKIN Skin is warm, Skin is dry, Skin is normal color.  LYMPHATIC   No adenopathy in neck.  PSYCHIATRIC not oriented    ED Medication Orders     ED Medication Orders (From admission, onward)      Start Ordered     Status Ordering Provider    11/13/21 [phone] 11/13/21 0822  tetanus-diphth-acell pertussis (BOOSTRIX) injection 0.5 mL  Once        Route: Intramuscular  Ordered Dose: 0.5 mL       Last MAR action: Given Danny Sutton A  Orders Placed During this Encounter     Orders Placed This Encounter   Procedures    CT Head without Contrast    CT Cervical Spine without Contrast       Diagnostic Study Results     Labs     Results       ** No results found for the last 24 hours. **            Radiologic Studies  Radiology Results (24 Hour)       Procedure Component Value Units Date/Time    CT Cervical Spine without Contrast  [[id number]] Collected: 11/13/21 0926    Order Status: Completed Updated: 11/13/21 0930    Narrative:      HISTORY: Trauma. Neck pain.    COMPARISON: None available.    TECHNIQUE: CT of the cervical spine performed without intravenous contrast. Multiplanar reformatted images were created and reviewed. The following dose reduction techniques were utilized: automated exposure control and/or adjustment of the mA and/or KV   according to patient size, and the use of an iterative reconstruction technique.    FINDINGS:   No evidence of acute fracture or traumatic misalignment of the cervical spine. Occipital condyles and skull base are intact. Osseous structures appear demineralized.    Degenerative changes are present throughout the cervical spine. The cervical lordosis is straightened. Moderate disc height loss and endplate degenerative change is present throughout the cervical spine. There is multilevel facet degeneration. Moderate   bilateral foraminal stenosis at C3-4 and C4-5. No acute spinal canal compromise.    No prevertebral or paraspinal soft tissue edema. Moderate calcified plaque at the left carotid bifurcation. Lung apices are clear.      Impression:        1.No evidence of acute cervical spine fracture.  2.Degenerative changes, as above.    Garnette Gunner, MD  11/13/2021 9:28 AM    CT Head without Contrast [[id number]] Collected: 11/13/21 0922    Order Status: Completed Updated: 11/13/21 0928    Narrative:      HISTORY: Trauma. Fall with head injury.    COMPARISON: None available.    TECHNIQUE: CT of the head performed without intravenous contrast. The following dose reduction techniques were utilized: automated exposure control and/or adjustment of the mA and/or KV according to patient size, and the use of an iterative   reconstruction technique.    FINDINGS:  There is no acute intracranial hemorrhage. No extra-axial fluid collection, midline shift, or mass effect. The ventricles and basal cisterns are patent.      There is no evidence of vasogenic edema or acute territorial infarction.  Mild chronic appearing microvascular ischemic changes are noted within the periventricular white matter. There is moderate to severe generalized volume loss with diffuse prominence   of the lateral ventricles and sulcal spaces.    No significant fluid in the visualized paranasal sinuses and mastoid air cells. Laceration mild soft tissue edema is noted in the left parietal scalp. No calvarial fractures.      Impression:         1.No CT evidence of acute territorial infarction or hemorrhage.  2.Left parietal scalp injury without underlying fracture.    Garnette Gunner, MD  11/13/2021 9:26 AM        .    Clinical Course / MDM       Notes:    MDM: This 81 year old male with history of dementia with aphasia at baseline has a mechanical fall ground-level fall found down  1 hour after last being seen.  Stable vital signs with small abrasion to the left occipital scalp.  CT head and cervical spine has been ordered for the patient.  If unremarkable patient is stable for outpatient follow-up and will be discharged back to the nursing home    Discussion of abnormal results/incidental findings:       Consults:      Data Review     Nursing records reviewed and agree: Yes    Pulse Oximetry Analysis - Normal  Laboratory results reviewed by EDP: Yes  Radiologic study results reviewed by EDP: Yes    Rendering Provider: Dr Danny Sutton    Monitors, EKG     Cardiac Monitor (interpreted by ED physician):      EKG (interpreted by ED physician):       Critical Care     Critical care exclusive of time spent performing procedures.    Total time:          Clinical Impression & Disposition     Clinical Impression:  1. Injury of head, initial encounter    2. Abrasion of scalp, initial encounter        Disposition  ED Disposition       ED Disposition   Discharge/Return to Nursing Home    Condition   --    Date/Time   Sun Nov 13, 2021  9:34 AM    Comment   --                Prescriptions    Discharge Medication List as of 11/13/2021 11:08 AM                 Lucille Passy, MD  11/15/21 2135

## 2021-11-13 NOTE — ED Triage Notes (Signed)
Seen by staff at 0630, found on floor at 0730. Skin tear left posterior head, bleeding controlled. Unknown LOC, no thinners. Hx demetia and parkinson's. Non verbal

## 2022-03-20 ENCOUNTER — Encounter: Payer: Self-pay | Admitting: *Deleted

## 2022-11-25 DEATH — deceased

## 2023-02-05 DIAGNOSIS — Z538 Procedure and treatment not carried out for other reasons: Secondary | ICD-10-CM | POA: Insufficient documentation
